# Patient Record
Sex: Female | Born: 1940
Health system: Southern US, Community
[De-identification: ages and names within clinical notes are randomized; demographics above are authoritative.]

## PROBLEM LIST (undated history)

## (undated) DIAGNOSIS — E119 Type 2 diabetes mellitus without complications: Secondary | ICD-10-CM

## (undated) DIAGNOSIS — I10 Essential (primary) hypertension: Secondary | ICD-10-CM

## (undated) DIAGNOSIS — M797 Fibromyalgia: Secondary | ICD-10-CM

## (undated) DIAGNOSIS — I872 Venous insufficiency (chronic) (peripheral): Secondary | ICD-10-CM

## (undated) DIAGNOSIS — E785 Hyperlipidemia, unspecified: Secondary | ICD-10-CM

## (undated) DIAGNOSIS — R000 Tachycardia, unspecified: Secondary | ICD-10-CM

## (undated) DIAGNOSIS — R002 Palpitations: Secondary | ICD-10-CM

## (undated) HISTORY — PX: CATARACT EXTRACTION, BILATERAL: SHX1313

## (undated) HISTORY — DX: Venous insufficiency (chronic) (peripheral): I87.2

## (undated) HISTORY — DX: Tachycardia, unspecified: R00.0

## (undated) HISTORY — DX: Essential (primary) hypertension: I10

## (undated) HISTORY — DX: Fibromyalgia: M79.7

## (undated) HISTORY — PX: ABDOMINAL HYSTERECTOMY: SHX81

## (undated) HISTORY — DX: Palpitations: R00.2

## (undated) HISTORY — DX: Hyperlipidemia, unspecified: E78.5

## (undated) HISTORY — DX: Type 2 diabetes mellitus without complications: E11.9

## (undated) HISTORY — PX: APPENDECTOMY: SHX54

## (undated) HISTORY — PX: TONSILLECTOMY: SUR1361

---

## 1998-06-16 ENCOUNTER — Ambulatory Visit (HOSPITAL_COMMUNITY): Admission: RE | Admit: 1998-06-16 | Discharge: 1998-06-16 | Payer: Self-pay | Admitting: Obstetrics and Gynecology

## 1998-06-21 ENCOUNTER — Ambulatory Visit (HOSPITAL_COMMUNITY): Admission: RE | Admit: 1998-06-21 | Discharge: 1998-06-21 | Payer: Self-pay | Admitting: Obstetrics and Gynecology

## 1999-01-20 ENCOUNTER — Other Ambulatory Visit: Admission: RE | Admit: 1999-01-20 | Discharge: 1999-01-20 | Payer: Self-pay | Admitting: Obstetrics and Gynecology

## 1999-01-26 ENCOUNTER — Encounter: Payer: Self-pay | Admitting: Obstetrics and Gynecology

## 1999-01-26 ENCOUNTER — Ambulatory Visit (HOSPITAL_COMMUNITY): Admission: RE | Admit: 1999-01-26 | Discharge: 1999-01-26 | Payer: Self-pay | Admitting: Obstetrics and Gynecology

## 1999-02-07 ENCOUNTER — Ambulatory Visit (HOSPITAL_COMMUNITY): Admission: RE | Admit: 1999-02-07 | Discharge: 1999-02-07 | Payer: Self-pay | Admitting: Gastroenterology

## 1999-06-06 ENCOUNTER — Encounter: Payer: Self-pay | Admitting: Obstetrics and Gynecology

## 1999-06-06 ENCOUNTER — Ambulatory Visit (HOSPITAL_COMMUNITY): Admission: RE | Admit: 1999-06-06 | Discharge: 1999-06-06 | Payer: Self-pay | Admitting: Obstetrics and Gynecology

## 2000-01-23 ENCOUNTER — Other Ambulatory Visit: Admission: RE | Admit: 2000-01-23 | Discharge: 2000-01-23 | Payer: Self-pay | Admitting: Obstetrics and Gynecology

## 2000-05-30 ENCOUNTER — Encounter: Payer: Self-pay | Admitting: Obstetrics and Gynecology

## 2000-05-30 ENCOUNTER — Encounter: Admission: RE | Admit: 2000-05-30 | Discharge: 2000-05-30 | Payer: Self-pay | Admitting: Obstetrics and Gynecology

## 2000-09-25 ENCOUNTER — Encounter: Admission: RE | Admit: 2000-09-25 | Discharge: 2000-09-25 | Payer: Self-pay | Admitting: Obstetrics and Gynecology

## 2000-09-25 ENCOUNTER — Encounter: Payer: Self-pay | Admitting: Obstetrics and Gynecology

## 2001-01-28 ENCOUNTER — Other Ambulatory Visit: Admission: RE | Admit: 2001-01-28 | Discharge: 2001-01-28 | Payer: Self-pay | Admitting: Obstetrics and Gynecology

## 2001-06-03 ENCOUNTER — Encounter: Payer: Self-pay | Admitting: Obstetrics and Gynecology

## 2001-06-03 ENCOUNTER — Ambulatory Visit (HOSPITAL_COMMUNITY): Admission: RE | Admit: 2001-06-03 | Discharge: 2001-06-03 | Payer: Self-pay | Admitting: Obstetrics and Gynecology

## 2002-06-05 ENCOUNTER — Ambulatory Visit (HOSPITAL_COMMUNITY): Admission: RE | Admit: 2002-06-05 | Discharge: 2002-06-05 | Payer: Self-pay | Admitting: Obstetrics and Gynecology

## 2002-06-05 ENCOUNTER — Encounter: Payer: Self-pay | Admitting: Obstetrics and Gynecology

## 2005-06-22 ENCOUNTER — Ambulatory Visit: Payer: Self-pay | Admitting: Cardiology

## 2005-12-04 ENCOUNTER — Ambulatory Visit: Payer: Self-pay | Admitting: Internal Medicine

## 2006-02-21 ENCOUNTER — Encounter: Payer: Self-pay | Admitting: Obstetrics and Gynecology

## 2006-06-26 ENCOUNTER — Ambulatory Visit: Payer: Self-pay | Admitting: Gastroenterology

## 2006-10-04 ENCOUNTER — Ambulatory Visit: Payer: Self-pay | Admitting: Gastroenterology

## 2007-01-16 ENCOUNTER — Ambulatory Visit: Payer: Self-pay | Admitting: Internal Medicine

## 2007-02-20 ENCOUNTER — Ambulatory Visit: Payer: Self-pay | Admitting: Internal Medicine

## 2007-02-28 ENCOUNTER — Ambulatory Visit: Payer: Self-pay | Admitting: Internal Medicine

## 2007-04-08 ENCOUNTER — Ambulatory Visit: Payer: Self-pay | Admitting: Internal Medicine

## 2008-01-20 ENCOUNTER — Ambulatory Visit: Payer: Self-pay | Admitting: Internal Medicine

## 2009-01-21 ENCOUNTER — Ambulatory Visit: Payer: Self-pay | Admitting: Internal Medicine

## 2010-01-24 ENCOUNTER — Ambulatory Visit: Payer: Self-pay | Admitting: Internal Medicine

## 2010-01-25 ENCOUNTER — Ambulatory Visit: Payer: Self-pay | Admitting: Internal Medicine

## 2010-07-12 ENCOUNTER — Ambulatory Visit: Payer: Self-pay | Admitting: Internal Medicine

## 2010-08-01 ENCOUNTER — Ambulatory Visit: Payer: Self-pay | Admitting: Internal Medicine

## 2010-08-15 ENCOUNTER — Ambulatory Visit: Payer: Self-pay | Admitting: Internal Medicine

## 2011-01-19 ENCOUNTER — Ambulatory Visit: Payer: Self-pay | Admitting: Internal Medicine

## 2011-03-22 ENCOUNTER — Emergency Department: Payer: Self-pay | Admitting: Emergency Medicine

## 2011-03-30 ENCOUNTER — Telehealth: Payer: Self-pay | Admitting: *Deleted

## 2011-03-30 NOTE — Telephone Encounter (Signed)
Pt called stating she wants to schedule an appt with Dr. Mariah Milling, she currently sees Dr. Darrold Junker and wants to change. She has been having palpitations with disorientation afterwards. She was seen in ER at [facility] 1 week ago, she has had a stress test at [facility] that was normal, and just recently had an echo, waiting on results. Pt had holter monitor ~ 2 years ago that was normal as well. On 12/21/10, she states she was cooking and all of the sudden had "pounding in chest, then when back to normal, she could not recognize any of her surroundings." Pt saw Dr. Randa Lynn at this time as well, and he states pt may have had TIAs and/or possible a fib. A few weeks ago pt had the exact same symptoms. Pt takes HCTZ/Triam 75-50, Crestor 10, Metoprolol succ 50, and ASA 81. Pt was scheduled to see Dr. Mariah Milling next week, pt will sign release form prior to then.

## 2011-04-06 ENCOUNTER — Encounter: Payer: Self-pay | Admitting: Cardiovascular Disease

## 2011-04-06 ENCOUNTER — Ambulatory Visit (INDEPENDENT_AMBULATORY_CARE_PROVIDER_SITE_OTHER): Payer: Medicare Other | Admitting: Cardiovascular Disease

## 2011-04-06 VITALS — BP 162/89 | HR 70 | Ht 63.0 in | Wt 163.0 lb

## 2011-04-06 DIAGNOSIS — R55 Syncope and collapse: Secondary | ICD-10-CM | POA: Insufficient documentation

## 2011-04-06 DIAGNOSIS — R000 Tachycardia, unspecified: Secondary | ICD-10-CM | POA: Insufficient documentation

## 2011-04-06 DIAGNOSIS — I10 Essential (primary) hypertension: Secondary | ICD-10-CM | POA: Insufficient documentation

## 2011-04-06 NOTE — Patient Instructions (Addendum)
  No medication changes were made. Please call us if you have new issues that need to be addressed before your next appt.  Your physician recommends that you schedule a follow-up appointment in: 1 month   Please get a BP cuff and monitor BP and heart rate and keep a diary of how often you have fast heart rate and what you are doing at the time of episode.

## 2011-04-06 NOTE — Assessment & Plan Note (Addendum)
She reports a long history of tachyarrhythmias that are brief though bothersome. It would appear that they have not been identified on Holter most recently or loop monitor last year. We have asked her to closely monitor her symptoms, keep a diary and to record her heart rate using her blood pressure cuff in an effort to identify how often She is having these episodes and how fast her heart rate is going.  She will buy a new blood pressure cuff as current  blood pressure cuff his old. We will discuss her events and arrhythmias in several weeks' time.

## 2011-04-06 NOTE — Assessment & Plan Note (Signed)
She reports some dizzy episodes, typically associated with the tachycardia. Her blood pressure is elevated today and she has not have orthostatic changes. Again we will closely watch her symptoms.

## 2011-04-06 NOTE — Progress Notes (Signed)
Patient ID: Destiny Ramos, female    DOB: Sep 30, 1941, 70 y.o.   MRN: [id number]  HPI Comments: Destiny Ramos is a very pleasant 70 year old woman, patient of Dr. Randa Lynn with a history of palpitations and tachycardia, hypertension, hyperlipidemia, fibromyalgia and chronic venous insufficiency who presents by self-referral for evaluation of recent episodes of tachycardia.  She reports that her symptoms have been going on for over a year. More recently, she has had several episodes at are concerning her. She was in art class on May 20 when she did not feel well. She was sitting and stood up to paint and she felt hot. She continued to not feel well and she went to the emergency room after people noticed her. In the emergency room, workup was essentially negative and she was discharged home.  She has had a Holter and 30 day event monitor that has shown no significant arrhythmias, apart from APCs. She reports that she did not have significant symptoms while he monitors were being worn.30 day monitor was started on April 20, 2010. Today Holter was born starting May 22  In early May, she had orthostatic type symptoms with some heart racing. Her metoprolol has been increased from 25 mg to 50 mg. Despite this, she has continued to have various episodes of tachypalpitations.  Earlier in the year in February, she had a virus and did not feel well. Around that time as she was recovering, she had periods of tachycardia while lying in bed.  She had a normal echocardiogram dated March 28, 2011 that showed normal LV systolic function, mild LVH, mild MR and TR   EKG today shows normal sinus rhythm with rate 71 beats per minute with nonspecific ST and T wave changes in V3 through V6, 2, 3, aVF     Review of Systems  Constitutional: Negative.   HENT: Negative.   Eyes: Negative.   Respiratory: Negative.   Cardiovascular: Positive for palpitations.  Gastrointestinal: Negative.   Musculoskeletal: Negative.     Skin: Negative.   Neurological: Negative.   Hematological: Negative.   Psychiatric/Behavioral: Negative.   All other systems reviewed and are negative.    BP 162/89  Pulse 70  Ht 5\' 3"  (1.6 m)  Wt 163 lb (73.936 kg)  BMI 28.87 kg/m2  Physical Exam  Nursing note and vitals reviewed. Constitutional: She is oriented to person, place, and time. She appears well-developed and well-nourished.  HENT:  Head: Normocephalic.  Nose: Nose normal.  Mouth/Throat: Oropharynx is clear and moist.  Eyes: Conjunctivae are normal. Pupils are equal, round, and reactive to light.  Neck: Normal range of motion. Neck supple. No JVD present.  Cardiovascular: Normal rate, regular rhythm, S1 normal, S2 normal, normal heart sounds and intact distal pulses.  Exam reveals no gallop and no friction rub.   No murmur heard. Pulmonary/Chest: Effort normal and breath sounds normal. No respiratory distress. She has no wheezes. She has no rales. She exhibits no tenderness.  Abdominal: Soft. Bowel sounds are normal. She exhibits no distension. There is no tenderness.  Musculoskeletal: Normal range of motion. She exhibits no edema and no tenderness.  Lymphadenopathy:    She has no cervical adenopathy.  Neurological: She is alert and oriented to person, place, and time. Coordination normal.  Skin: Skin is warm and dry. No rash noted. No erythema.  Psychiatric: She has a normal mood and affect. Her behavior is normal. Judgment and thought content normal.         Assessment and  Plan

## 2011-04-06 NOTE — Assessment & Plan Note (Signed)
Blood pressure is elevated today. She reports it is improved at home. We have asked her to write down her blood pressure numbers so we can go over these on the next visit.

## 2011-04-21 ENCOUNTER — Encounter: Payer: Self-pay | Admitting: Cardiovascular Disease

## 2011-05-08 ENCOUNTER — Ambulatory Visit (INDEPENDENT_AMBULATORY_CARE_PROVIDER_SITE_OTHER): Payer: Medicare Other | Admitting: Cardiovascular Disease

## 2011-05-08 ENCOUNTER — Encounter: Payer: Self-pay | Admitting: Cardiovascular Disease

## 2011-05-08 DIAGNOSIS — E785 Hyperlipidemia, unspecified: Secondary | ICD-10-CM

## 2011-05-08 DIAGNOSIS — R000 Tachycardia, unspecified: Secondary | ICD-10-CM

## 2011-05-08 DIAGNOSIS — E782 Mixed hyperlipidemia: Secondary | ICD-10-CM | POA: Insufficient documentation

## 2011-05-08 DIAGNOSIS — I10 Essential (primary) hypertension: Secondary | ICD-10-CM

## 2011-05-08 DIAGNOSIS — R55 Syncope and collapse: Secondary | ICD-10-CM

## 2011-05-08 NOTE — Assessment & Plan Note (Signed)
Symptoms concerning for atrial tachycardia or SVT. Episodes are of such short duration, it would not work to take an extra dose of medication as needed. She is not tolerate higher doses of metoprolol. As her symptoms are infrequent, we will monitor them for now. We have talked her carotid sinus massage and Valsalva and suggested if her symptoms last longer, go to the fire department for EKG.

## 2011-05-08 NOTE — Assessment & Plan Note (Signed)
Blood pressure is well controlled on today's visit. No changes made to the medications. 

## 2011-05-08 NOTE — Progress Notes (Signed)
Patient ID: Destiny Ramos, female    DOB: 1941/09/02, 70 y.o.   MRN: [id number]  HPI Comments: Destiny Ramos is a very pleasant 70 year old woman, patient of Dr. Randa Lynn with a history of palpitations and tachycardia, hypertension, hyperlipidemia, fibromyalgia and chronic venous insufficiency who presents for routine followup.  She reports that she recently had an episode of tachycardia. On July 7, she was sitting in her chair when she had several minutes of significant tachycardia. Her blood pressure was very low at the time and her heart rate monitor could not read the speed up her pulse. She reports that her husband did appreciate her tachycardia. She reports having at least 6 episodes over the past 2 years, starting in November 2000 and, each lasting for several minutes at a time. Overall her blood pressure has been excellent, averaging in the 110-120 range with heart rates in the 60s, occasional 70s. She had a previous episode while standing at the sink, and another episode during the stress of Thanksgiving. She has been to the emergency room for these symptoms in the past with negative workup.  She has had a Holter and 30 day event monitor that has shown no significant arrhythmias, apart from APCs. She reports that she did not have significant symptoms while he monitors were being worn.30 day monitor was started on April 20, 2010.   In early May, she had orthostatic type symptoms with some heart racing. Her metoprolol has been increased from 25 mg to 50 mg. Despite this, she has continued to have various episodes of tachypalpitations. She did not feel well on high dose metoprolol and is now back on 25 mg daily.  She had a normal echocardiogram dated March 28, 2011 that showed normal LV systolic function, mild LVH, mild MR and TR   Old EKG  shows normal sinus rhythm with rate 71 beats per minute with nonspecific ST and T wave changes in V3 through V6, 2, 3, aVF    Outpatient Encounter  Prescriptions as of 05/08/2011  Medication Sig Dispense Refill  . aspirin 81 MG EC tablet Take 81 mg by mouth daily.        . metoprolol (TOPROL-XL) 50 MG 24 hr tablet Take 25 mg by mouth daily.       . rosuvastatin (CRESTOR) 10 MG tablet Take 10 mg by mouth as directed. Take 10mg  on M,W,F      . triamterene-hydrochlorothiazide (MAXZIDE-25) 37.5-25 MG per tablet Take 1 tablet by mouth daily.           Review of Systems  Constitutional: Negative.   HENT: Negative.   Eyes: Negative.   Respiratory: Negative.   Cardiovascular: Positive for palpitations.  Gastrointestinal: Negative.   Musculoskeletal: Negative.   Skin: Negative.   Neurological: Negative.   Hematological: Negative.   Psychiatric/Behavioral: Negative.   All other systems reviewed and are negative.    BP 134/80  Pulse 80  Ht 5\' 3"  (1.6 m)  Wt 163 lb (73.936 kg)  BMI 28.87 kg/m2   Physical Exam  Nursing note and vitals reviewed. Constitutional: She is oriented to person, place, and time. She appears well-developed and well-nourished.  HENT:  Head: Normocephalic.  Nose: Nose normal.  Mouth/Throat: Oropharynx is clear and moist.  Eyes: Conjunctivae are normal. Pupils are equal, round, and reactive to light.  Neck: Normal range of motion. Neck supple. No JVD present.  Cardiovascular: Normal rate, regular rhythm, S1 normal, S2 normal, normal heart sounds and intact distal pulses.  Exam  reveals no gallop and no friction rub.   No murmur heard. Pulmonary/Chest: Effort normal and breath sounds normal. No respiratory distress. She has no wheezes. She has no rales. She exhibits no tenderness.  Abdominal: Soft. Bowel sounds are normal. She exhibits no distension. There is no tenderness.  Musculoskeletal: Normal range of motion. She exhibits no edema and no tenderness.  Lymphadenopathy:    She has no cervical adenopathy.  Neurological: She is alert and oriented to person, place, and time. Coordination normal.  Skin: Skin  is warm and dry. No rash noted. No erythema.  Psychiatric: She has a normal mood and affect. Her behavior is normal. Judgment and thought content normal.         Assessment and Plan

## 2011-05-08 NOTE — Assessment & Plan Note (Signed)
Currently on Crestor 

## 2011-05-08 NOTE — Patient Instructions (Signed)
You are doing well. No medication changes were made. Please call us if you have new issues that need to be addressed before your next appt.  We will call you for a follow up Appt. In 6 months  

## 2011-10-10 ENCOUNTER — Ambulatory Visit: Payer: Self-pay | Admitting: Internal Medicine

## 2011-10-11 ENCOUNTER — Ambulatory Visit: Payer: Self-pay | Admitting: Internal Medicine

## 2011-11-09 ENCOUNTER — Ambulatory Visit: Payer: Medicare Other | Admitting: Cardiovascular Disease

## 2011-11-10 ENCOUNTER — Encounter: Payer: Self-pay | Admitting: Cardiovascular Disease

## 2011-11-10 ENCOUNTER — Ambulatory Visit (INDEPENDENT_AMBULATORY_CARE_PROVIDER_SITE_OTHER): Payer: Medicare Other | Admitting: Cardiovascular Disease

## 2011-11-10 DIAGNOSIS — E785 Hyperlipidemia, unspecified: Secondary | ICD-10-CM

## 2011-11-10 DIAGNOSIS — I10 Essential (primary) hypertension: Secondary | ICD-10-CM

## 2011-11-10 DIAGNOSIS — R000 Tachycardia, unspecified: Secondary | ICD-10-CM

## 2011-11-10 DIAGNOSIS — R55 Syncope and collapse: Secondary | ICD-10-CM

## 2011-11-10 NOTE — Progress Notes (Signed)
Patient ID: Destiny Ramos, female    DOB: [birth year], 71 y.o.   MRN: [id number]  HPI Comments: Destiny Ramos is a very pleasant 71 year old woman, patient of Dr. Randa Lynn with a history of palpitations and tachycardia, hypertension, hyperlipidemia, fibromyalgia and chronic venous insufficiency who presents for routine followup.  She has had a Holter and 30 day event monitor that has shown no significant arrhythmias, apart from APCs. She reports that she did not have significant symptoms while he monitors were being worn.30 day monitor was started on April 20, 2010.   In early May, she had orthostatic type symptoms with some heart racing. Her metoprolol has been increased from 25 mg to 50 mg. Despite this, she has continued to have various episodes of tachypalpitations. She did not feel well on high dose metoprolol and is now back on 25 mg daily.  She reports that she has had recent bronchitis. She did a short course of amoxicillin but did not complete her antibiotic course. Symptoms came back. Now she is on Levaquin and she has started to feel better in the past several days. She has noticed her low blood pressure 2 days ago with systolic pressures in the 80s and 90s. She does have general malaise.  Previous normal echocardiogram dated March 28, 2011 that showed normal LV systolic function, mild LVH, mild MR and TR   Old EKG  shows normal sinus rhythm with rate 90 beats per minute with T Wave abnormality in V1 through V4    Outpatient Encounter Prescriptions as of 05/08/2011  Medication Sig Dispense Refill  . aspirin 81 MG EC tablet Take 81 mg by mouth daily.        . metoprolol (TOPROL-XL) 50 MG 24 hr tablet Take 25 mg by mouth daily.       . rosuvastatin (CRESTOR) 10 MG tablet Take 10 mg by mouth as directed. Take 10mg  on M,W,F      . triamterene-hydrochlorothiazide (MAXZIDE-25) 37.5-25 MG per tablet Take 1 tablet by mouth daily.           Review of Systems  Constitutional: Negative.     HENT: Negative.   Eyes: Negative.   Respiratory: Negative.   Cardiovascular: Positive for palpitations.  Gastrointestinal: Negative.   Musculoskeletal: Negative.   Skin: Negative.   Neurological: Negative.   Hematological: Negative.   Psychiatric/Behavioral: Negative.   All other systems reviewed and are negative.    BP 140/90  Pulse 90  Ht 5\' 3"  (1.6 m)  Wt 168 lb (76.204 kg)  BMI 29.76 kg/m2   Physical Exam  Nursing note and vitals reviewed. Constitutional: She is oriented to person, place, and time. She appears well-developed and well-nourished.  HENT:  Head: Normocephalic.  Nose: Nose normal.  Mouth/Throat: Oropharynx is clear and moist.  Eyes: Conjunctivae are normal. Pupils are equal, round, and reactive to light.  Neck: Normal range of motion. Neck supple. No JVD present.  Cardiovascular: Normal rate, regular rhythm, S1 normal, S2 normal, normal heart sounds and intact distal pulses.  Exam reveals no gallop and no friction rub.   No murmur heard. Pulmonary/Chest: Effort normal and breath sounds normal. No respiratory distress. She has no wheezes. She has no rales. She exhibits no tenderness.  Abdominal: Soft. Bowel sounds are normal. She exhibits no distension. There is no tenderness.  Musculoskeletal: Normal range of motion. She exhibits no edema and no tenderness.  Lymphadenopathy:    She has no cervical adenopathy.  Neurological: She is alert and oriented  to person, place, and time. Coordination normal.  Skin: Skin is warm and dry. No rash noted. No erythema.  Psychiatric: She has a normal mood and affect. Her behavior is normal. Judgment and thought content normal.         Assessment and Plan

## 2011-11-10 NOTE — Patient Instructions (Addendum)
You are doing well. If your blood pressure is running low, hold the triamterene/HCTZ. You may want to do a half a blood pressure pill while sick.   Please call us if you have new issues that need to be addressed before your next appt.  Your physician wants you to follow-up in: 6 months.  You will receive a reminder letter in the mail two months in advance. If you don't receive a letter, please call our office to schedule the follow-up appointment.

## 2011-11-10 NOTE — Assessment & Plan Note (Signed)
Rare episodes of tachycardia. She does take extra metoprolol as needed.

## 2011-11-10 NOTE — Assessment & Plan Note (Signed)
Blood pressure is running low in the setting of recent bronchitis. I think she is turning the corner and started to feel better. I suggested she cut her triamterene HCTZ and half And closely monitor her blood pressure to avoid hypotension. Once her bronchitis has improved, she could possibly go back on a full pill.

## 2011-11-10 NOTE — Assessment & Plan Note (Signed)
Continue Crestor 

## 2011-11-13 ENCOUNTER — Telehealth: Payer: Self-pay | Admitting: *Deleted

## 2011-11-13 NOTE — Telephone Encounter (Signed)
Pt held triamterene/hctz starting Sat AM (appt 1/18, low BP due to acute bronchitis, instructed to hold if BP low and take 1/2 while still sick). Pt actually just held med entire time. This AM c/o swelling in both knees and painful. She thinks she has almost fully recovered from bronchitis, and BPs since 1/18 (and off triam/hctz) have been: 144/63, 116/67, 140/67, 136/69, 122/61, HR in 70s. I told pt to go ahead and go back on med starting with 1/2 tab, she said she already took a whole tablet this Am. I advised if BP does ok on whole tablet, to continue and call back Friday with update of fluid and BP. Will forward to Dr. Mariah Milling.

## 2011-11-14 ENCOUNTER — Telehealth: Payer: Self-pay

## 2011-11-14 NOTE — Telephone Encounter (Signed)
The patient is still having swelling in her knees up to her thigh. She has taken the full tablet of the triam/hctz as of yesterday like she was instructed. Denies shortness of breath. She does not have a lot of output. She is drinking a lot of water. Please advise what to do.   Lanny Hurst, RN 11/13/2011 12:06 PM Signed  Pt held triamterene/hctz starting Sat AM (appt 1/18, low BP due to acute bronchitis, instructed to hold if BP low and take 1/2 while still sick). Pt actually just held med entire time. This AM c/o swelling in both knees and painful. She thinks she has almost fully recovered from bronchitis, and BPs since 1/18 (and off triam/hctz) have been: 144/63, 116/67, 140/67, 136/69, 122/61, HR in 70s. I told pt to go ahead and go back on med starting with 1/2 tab, she said she already took a whole tablet this Am. I advised if BP does ok on whole tablet, to continue and call back Friday with update of fluid and BP. Will forward to Dr. Mariah Milling.

## 2011-11-14 NOTE — Telephone Encounter (Signed)
Patient is c/o edema in knees.

## 2011-11-15 NOTE — Telephone Encounter (Signed)
Notified to cut back on fluid intake if fluid retention still in knee and thigh area. Told echo did not show excess fluids in her and that she may need to see Dr. Randa Lynn if cutting back on fluids does not help the swelling in knee and thigh area. She will also continue with the  BP pill as she was doing before she cut it down. She will monitor her BP at home. The patient will follow instructions as above per Dr. Mariah Milling.

## 2011-11-15 NOTE — Telephone Encounter (Signed)
Fluid retention would probably start in her ankles, not knees/thighs. Would continue BP pill as she was doing before she cut down. Monitor BP at home. If she thinks knee and thigh swelling is fluid retention, cut back on fluid intake. May need to see Dr. Randa Lynn if knee thigh swelling persists despite cutting back on fluids.  Previous echo did not show excess fluids in her.

## 2011-11-20 ENCOUNTER — Ambulatory Visit: Payer: Medicare Other | Admitting: Cardiovascular Disease

## 2012-02-06 ENCOUNTER — Telehealth: Payer: Self-pay | Admitting: Cardiovascular Disease

## 2012-02-06 NOTE — Telephone Encounter (Signed)
Spoke with pt. Pt states while reading paper on Saturday she felt dizzy,  like she might black out, but she did not black out. Saturday her BP was 136/70 pulse 86 about 20-30 minutes later.

## 2012-02-06 NOTE — Telephone Encounter (Signed)
Would stay on metoprolol succinate 25 mg daily She could hold her triamterene HCTZ We can get her a new prescription for HCTZ 25 mg She can take a half HCTZ for 12.5 mg daily as needed for blood pressure greater than 130  Her blood pressure was low on her last clinic visit. Initially it was felt secondary to infection/bronchitis. Would picture that she is not dehydrated as HCTZ can cause mild dehydration.

## 2012-02-06 NOTE — Telephone Encounter (Signed)
Pt states she took both maxide and metoprolol on Saturday but has not taken any maxide since Saturday 02/03/12.  BP 4/14 125/65 pulse 73 left arm/ 104/64 right arm 4/15 121/59 pulse 63 02/06/12 130/58 pulse 85. Pt states she has not had anymore dizziness since Saturday.Pt asking what she should do about her medication. I will forward to Dr Mariah Milling for review and recommendations.

## 2012-02-06 NOTE — Telephone Encounter (Signed)
Pt calling stating that her BP has been running low. Hasn't taken her BP meds since late Saturday. Wants to know what she should do  13th 136/70 pulse 86 14th 125/65 pulse 73 104/64 in right arm 15th 121/59 pulse 63

## 2012-02-07 ENCOUNTER — Telehealth: Payer: Self-pay | Admitting: *Deleted

## 2012-02-07 ENCOUNTER — Other Ambulatory Visit: Payer: Self-pay | Admitting: *Deleted

## 2012-02-07 DIAGNOSIS — I951 Orthostatic hypotension: Secondary | ICD-10-CM

## 2012-02-07 MED ORDER — HYDROCHLOROTHIAZIDE 12.5 MG PO CAPS: ORAL_CAPSULE | ORAL | Status: DC

## 2012-02-07 NOTE — Telephone Encounter (Signed)
LMTCB--NT 

## 2012-02-07 NOTE — Telephone Encounter (Signed)
Spoke with pt and explained dr Ethelene Hal instructions when taking HCTZ--called in RX to [facility]--- HCTZ 12.5mg  --take 1/2 tab po qd if systolic blood pressure over 130--pt agrees--nt

## 2012-05-08 ENCOUNTER — Ambulatory Visit: Payer: Medicare Other | Admitting: Cardiovascular Disease

## 2012-07-12 ENCOUNTER — Ambulatory Visit: Payer: Medicare Other | Admitting: Cardiovascular Disease

## 2012-07-15 ENCOUNTER — Ambulatory Visit: Payer: Medicare Other | Admitting: Cardiovascular Disease

## 2012-08-01 ENCOUNTER — Ambulatory Visit: Payer: Medicare Other | Admitting: Cardiovascular Disease

## 2012-08-16 ENCOUNTER — Ambulatory Visit: Payer: Medicare Other | Admitting: Cardiovascular Disease

## 2012-08-19 ENCOUNTER — Encounter: Payer: Self-pay | Admitting: Cardiovascular Disease

## 2012-11-19 ENCOUNTER — Encounter: Payer: Self-pay | Admitting: Cardiovascular Disease

## 2012-11-19 ENCOUNTER — Ambulatory Visit (INDEPENDENT_AMBULATORY_CARE_PROVIDER_SITE_OTHER): Payer: Medicare Other | Admitting: Cardiovascular Disease

## 2012-11-19 VITALS — BP 137/85 | HR 94 | Ht 63.0 in | Wt 168.0 lb

## 2012-11-19 DIAGNOSIS — R000 Tachycardia, unspecified: Secondary | ICD-10-CM

## 2012-11-19 DIAGNOSIS — R55 Syncope and collapse: Secondary | ICD-10-CM

## 2012-11-19 DIAGNOSIS — E785 Hyperlipidemia, unspecified: Secondary | ICD-10-CM

## 2012-11-19 DIAGNOSIS — I10 Essential (primary) hypertension: Secondary | ICD-10-CM

## 2012-11-19 DIAGNOSIS — R42 Dizziness and giddiness: Secondary | ICD-10-CM | POA: Insufficient documentation

## 2012-11-19 NOTE — Assessment & Plan Note (Addendum)
lightheaded symptoms of uncertain etiology, possibly from her new diet. Recent 6 pound weight loss in the past week on shakes . We have asked her to start eating normally, encouraged fluids . No recent medication changes . Less likely cardiac etiology as rhythm and blood pressure is normal.

## 2012-11-19 NOTE — Progress Notes (Signed)
Patient ID: Destiny Ramos, female    DOB: [birth year], 72 y.o.   MRN: [id number]  HPI Comments: Destiny Ramos is a very pleasant 72 year old woman, patient of Dr. Randa Lynn with a history of palpitations and tachycardia, hypertension, hyperlipidemia, fibromyalgia and chronic venous insufficiency who presents for routine followup.  Prior Holter and 30 day event monitor showed  no significant arrhythmias, apart from APCs. She reports that she did not have significant symptoms while he monitors were being worn.30 day monitor was started on April 20, 2010.   In early May 2013, she had orthostatic type symptoms with some heart racing. Her metoprolol has been increased from 25 mg to 50 mg. Despite this, she has continued to have various episodes of tachypalpitations.   She reports starting a new protein shake diet over the past week. 2 shakes per day and she has dropped 6 pounds. She did have a episode while on this new diet of severe lightheadedness while driving. She managed to drive home and symptoms seemed to resolve on their own. She continues to feel lightheaded. Blood pressure at home and here today is in a reasonable range. She denies any tachycardia or palpitations  Previous normal echocardiogram dated March 28, 2011 that showed normal LV systolic function, mild LVH, mild MR and TR    EKG  shows normal sinus rhythm with rate 89 beats per minute with T Wave abnormality in V1 through V4    Outpatient Encounter Prescriptions as of 11/19/2012  Medication Sig Dispense Refill  . albuterol (PROVENTIL HFA;VENTOLIN HFA) 108 (90 BASE) MCG/ACT inhaler Inhale 2 puffs into the lungs every 6 (six) hours as needed.      Marland Kitchen aspirin 81 MG EC tablet Take 81 mg by mouth daily.        . hydrochlorothiazide (MICROZIDE) 12.5 MG capsule Take 1/2 tab po qd for systolic blood pressure over 130--do not take if systolic blood pressure under  130  15 capsule  11  . metoprolol succinate (TOPROL-XL) 25 MG 24 hr tablet Take 25  mg by mouth daily.      . [DISCONTINUED] Guaifenesin (MUCINEX MAXIMUM STRENGTH) 1200 MG TB12 Take 1,200 mg by mouth daily.      . [DISCONTINUED] levofloxacin (LEVAQUIN) 500 MG tablet Take 500 mg by mouth daily.      . [DISCONTINUED] metoprolol succinate (TOPROL-XL) 25 MG 24 hr tablet Take 25 mg by mouth daily.      . [DISCONTINUED] rosuvastatin (CRESTOR) 10 MG tablet Take 10 mg by mouth as directed. Take 10mg  on M,W,F         Review of Systems  Constitutional: Negative.   HENT: Negative.   Eyes: Negative.   Respiratory: Negative.   Cardiovascular: Positive for palpitations.  Gastrointestinal: Negative.   Musculoskeletal: Negative.   Skin: Negative.   Neurological: Positive for light-headedness.  Hematological: Negative.   Psychiatric/Behavioral: Negative.   All other systems reviewed and are negative.    BP 137/85  Pulse 94  Ht 5\' 3"  (1.6 m)  Wt 168 lb (76.204 kg)  BMI 29.76 kg/m2  Physical Exam  Nursing note and vitals reviewed. Constitutional: She is oriented to person, place, and time. She appears well-developed and well-nourished.  HENT:  Head: Normocephalic.  Nose: Nose normal.  Mouth/Throat: Oropharynx is clear and moist.  Eyes: Conjunctivae normal are normal. Pupils are equal, round, and reactive to light.  Neck: Normal range of motion. Neck supple. No JVD present.  Cardiovascular: Normal rate, regular rhythm, S1 normal, S2 normal,  normal heart sounds and intact distal pulses.  Exam reveals no gallop and no friction rub.   No murmur heard. Pulmonary/Chest: Effort normal and breath sounds normal. No respiratory distress. She has no wheezes. She has no rales. She exhibits no tenderness.  Abdominal: Soft. Bowel sounds are normal. She exhibits no distension. There is no tenderness.  Musculoskeletal: Normal range of motion. She exhibits no edema and no tenderness.  Lymphadenopathy:    She has no cervical adenopathy.  Neurological: She is alert and oriented to person,  place, and time. Coordination normal.  Skin: Skin is warm and dry. No rash noted. No erythema.  Psychiatric: She has a normal mood and affect. Her behavior is normal. Judgment and thought content normal.         Assessment and Plan

## 2012-11-19 NOTE — Assessment & Plan Note (Signed)
No recent episodes of tachycardia. Continue beta blocker.

## 2012-11-19 NOTE — Assessment & Plan Note (Signed)
Blood pressure is well controlled on today's visit. No changes made to the medications. 

## 2012-11-19 NOTE — Patient Instructions (Addendum)
You are doing well. No medication changes were made.  Please call us if you have new issues that need to be addressed before your next appt.  Your physician wants you to follow-up in: 6 months.  You will receive a reminder letter in the mail two months in advance. If you don't receive a letter, please call our office to schedule the follow-up appointment.   

## 2012-11-19 NOTE — Assessment & Plan Note (Signed)
Previously on Crestor. She stopped this on her own

## 2013-05-19 ENCOUNTER — Ambulatory Visit (INDEPENDENT_AMBULATORY_CARE_PROVIDER_SITE_OTHER): Payer: Medicare Other | Admitting: Cardiovascular Disease

## 2013-05-19 ENCOUNTER — Telehealth: Payer: Self-pay

## 2013-05-19 ENCOUNTER — Encounter: Payer: Self-pay | Admitting: Cardiovascular Disease

## 2013-05-19 VITALS — BP 160/82 | HR 81 | Ht 62.0 in | Wt 174.2 lb

## 2013-05-19 DIAGNOSIS — R0789 Other chest pain: Secondary | ICD-10-CM

## 2013-05-19 DIAGNOSIS — R000 Tachycardia, unspecified: Secondary | ICD-10-CM

## 2013-05-19 DIAGNOSIS — R55 Syncope and collapse: Secondary | ICD-10-CM

## 2013-05-19 DIAGNOSIS — E785 Hyperlipidemia, unspecified: Secondary | ICD-10-CM

## 2013-05-19 DIAGNOSIS — I10 Essential (primary) hypertension: Secondary | ICD-10-CM

## 2013-05-19 DIAGNOSIS — R002 Palpitations: Secondary | ICD-10-CM

## 2013-05-19 DIAGNOSIS — E7800 Pure hypercholesterolemia, unspecified: Secondary | ICD-10-CM

## 2013-05-19 MED ORDER — PROPRANOLOL HCL 10 MG PO TABS: 10.0000 mg | ORAL_TABLET | Freq: Three times a day (TID) | ORAL | Status: DC | PRN

## 2013-05-19 NOTE — Patient Instructions (Addendum)
You are doing well. Please take propranolol as needed for palpitations  Please call us if you have new issues that need to be addressed before your next appt.  Your physician wants you to follow-up in: 6 months.  You will receive a reminder letter in the mail two months in advance. If you don't receive a letter, please call our office to schedule the follow-up appointment.

## 2013-05-19 NOTE — Progress Notes (Signed)
Patient ID: Destiny Ramos, female    DOB: 11-20-1940, 72 y.o.   MRN: [id number]  HPI Comments: Destiny Ramos is a very pleasant 72 year old woman, patient of Dr. Randa Lynn with a history of palpitations and tachycardia, hypertension, hyperlipidemia, fibromyalgia and chronic venous insufficiency who presents for routine followup. She reports a previous near syncope episode, fall with concussion several years ago  Prior Holter for 30 day event monitor showed  no significant arrhythmias, apart from APCs. She reports that she did not have significant symptoms while he monitors were being worn.30 day monitor was started on April 20, 2010.   In early May 2013, she had orthostatic type symptoms with some heart racing. Her metoprolol has been increased from 25 mg to 50 mg.   She is doing well overall. Very active on a choir. Does not exercise on a regular basis. Has palpitations, fluttering once per month. Episodes while she is cooking in the kitchen and she has to sit down. After she sits down, symptoms resolved. She typically takes metoprolol when necessary for breakthrough.  Previous normal echocardiogram dated March 28, 2011 that showed normal LV systolic function, mild LVH, mild MR and TR    EKG  shows normal sinus rhythm with rate 81 beats per minute with T Wave abnormality in V1 through V4  Total cholesterol 152 several years ago, normal echo in 2012   Outpatient Encounter Prescriptions as of 05/19/2013  Medication Sig Dispense Refill  . albuterol (PROVENTIL HFA;VENTOLIN HFA) 108 (90 BASE) MCG/ACT inhaler Inhale 2 puffs into the lungs every 6 (six) hours as needed.      Marland Kitchen aspirin 81 MG EC tablet Take 81 mg by mouth daily.        . hydrochlorothiazide (MICROZIDE) 12.5 MG capsule Take 1/2 tab po qd for systolic blood pressure over 130--do not take if systolic blood pressure under  130  15 capsule  11  . metoprolol succinate (TOPROL-XL) 25 MG 24 hr tablet Take 25 mg by mouth daily.       No  facility-administered encounter medications on file as of 05/19/2013.     Review of Systems  Constitutional: Negative.   HENT: Negative.   Eyes: Negative.   Respiratory: Negative.   Cardiovascular: Positive for palpitations.  Gastrointestinal: Negative.   Musculoskeletal: Negative.   Skin: Negative.   Psychiatric/Behavioral: Negative.   All other systems reviewed and are negative.    BP 160/82  Pulse 81  Ht 5\' 2"  (1.575 m)  Wt 174 lb 4 oz (79.039 kg)  BMI 31.86 kg/m2  Physical Exam  Nursing note and vitals reviewed. Constitutional: She is oriented to person, place, and time. She appears well-developed and well-nourished.  HENT:  Head: Normocephalic.  Nose: Nose normal.  Mouth/Throat: Oropharynx is clear and moist.  Eyes: Conjunctivae are normal. Pupils are equal, round, and reactive to light.  Neck: Normal range of motion. Neck supple. No JVD present.  Cardiovascular: Normal rate, regular rhythm, S1 normal, S2 normal, normal heart sounds and intact distal pulses.  Exam reveals no gallop and no friction rub.   No murmur heard. Pulmonary/Chest: Effort normal and breath sounds normal. No respiratory distress. She has no wheezes. She has no rales. She exhibits no tenderness.  Abdominal: Soft. Bowel sounds are normal. She exhibits no distension. There is no tenderness.  Musculoskeletal: Normal range of motion. She exhibits no edema and no tenderness.  Lymphadenopathy:    She has no cervical adenopathy.  Neurological: She is alert and oriented to  person, place, and time. Coordination normal.  Skin: Skin is warm and dry. No rash noted. No erythema.  Psychiatric: She has a normal mood and affect. Her behavior is normal. Judgment and thought content normal.    Assessment and Plan

## 2013-05-19 NOTE — Telephone Encounter (Signed)
Attempted TCB pt.  LMTCB.

## 2013-05-19 NOTE — Assessment & Plan Note (Signed)
No recent labs available. Lab work from 2012 showed total cholesterol 150

## 2013-05-19 NOTE — Assessment & Plan Note (Signed)
No further episodes of near syncope, passing out. Previous concussion.

## 2013-05-19 NOTE — Assessment & Plan Note (Signed)
We have suggested she take propranolol as needed for breakthrough palpitations or tachycardia and call our office if this happens more frequently.

## 2013-05-19 NOTE — Telephone Encounter (Signed)
Called spoke with pt. Pt saw Dr Mariah Milling today.  Had cholesterol checked on 04/28/13 lifeline screening at the [facility]. Will forward results to Dr Mariah Milling for review.

## 2013-05-19 NOTE — Assessment & Plan Note (Signed)
Blood pressure is well controlled on today's visit. No changes made to the medications. 

## 2013-05-19 NOTE — Telephone Encounter (Signed)
Pt left VM.Marland Kitchen Total cholesterol 212, HDL 53, LDL 132, triglycerides 136

## 2013-05-19 NOTE — Telephone Encounter (Signed)
Cholesterol is a little high Which she like a generic cholesterol medication such as lovastatin 20 mg daily?

## 2013-05-20 MED ORDER — LOVASTATIN 20 MG PO TABS: 20.0000 mg | ORAL_TABLET | Freq: Every day | ORAL | Status: DC

## 2013-05-20 NOTE — Telephone Encounter (Signed)
The patient is aware of Dr. Mariah Milling recommendations and is agreeable with starting lovastatin 20 mg daily. RX sent to [facility] pharmacy.

## 2013-10-21 ENCOUNTER — Ambulatory Visit: Payer: Self-pay | Admitting: Internal Medicine

## 2013-11-19 ENCOUNTER — Ambulatory Visit (INDEPENDENT_AMBULATORY_CARE_PROVIDER_SITE_OTHER): Payer: Medicare Other | Admitting: Cardiovascular Disease

## 2013-11-19 ENCOUNTER — Encounter: Payer: Self-pay | Admitting: Cardiovascular Disease

## 2013-11-19 VITALS — BP 135/75 | HR 100 | Ht 63.0 in | Wt 166.8 lb

## 2013-11-19 DIAGNOSIS — I10 Essential (primary) hypertension: Secondary | ICD-10-CM

## 2013-11-19 DIAGNOSIS — E785 Hyperlipidemia, unspecified: Secondary | ICD-10-CM

## 2013-11-19 DIAGNOSIS — R55 Syncope and collapse: Secondary | ICD-10-CM

## 2013-11-19 DIAGNOSIS — R000 Tachycardia, unspecified: Secondary | ICD-10-CM

## 2013-11-19 MED ORDER — LOVASTATIN 20 MG PO TABS: 20.0000 mg | ORAL_TABLET | Freq: Every day | ORAL | Status: DC

## 2013-11-19 NOTE — Progress Notes (Signed)
Patient ID: Jaanvi Fizer, female    DOB: [birth year], 73 y.o.   MRN: [id number]  HPI Comments: Ms. Latella is a very pleasant 73 year old woman, patient of Dr. Emily Filbert with a history of palpitations and tachycardia, hypertension, hyperlipidemia, fibromyalgia and chronic venous insufficiency who presents for routine followup. Previous episode of near syncope, fall with concussion several years ago  Prior Holter for 30 day event monitor showed  no significant arrhythmias, apart from APCs. She reports that she did not have significant symptoms while he monitors were being worn.30 day monitor was started on April 20, 2010.   In early May 2013, she had orthostatic type symptoms with some heart racing.   In followup today, She is doing well overall. Very active on a choir. Does not exercise on a regular basis. Has palpitations, fluttering rarely. Previously had Episodes while she is cooking in the kitchen and she has to sit down. After she sits down, symptoms resolved. She typically takes metoprolol when necessary for breakthrough. 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 Symptoms resolved in less than 1 minute. No further episodes since that time. She has taken herself off Crestor. Uncertain why.  Previous normal echocardiogram dated March 28, 2011 that showed normal LV systolic function, mild LVH, mild MR and TR    EKG  shows normal sinus rhythm with rate 100 beats per minute with T Wave abnormality in V1 through V4  Total cholesterol 152 several years ago while on a statin.  Most recent cholesterol 226, LDL 141   Outpatient Encounter Prescriptions as of 11/19/2013  Medication Sig  . albuterol (PROVENTIL HFA;VENTOLIN HFA) 108 (90 BASE) MCG/ACT inhaler Inhale 2 puffs into the lungs every 6 (six) hours as needed.  Marland Kitchen aspirin 81 MG EC tablet Take 81 mg by mouth daily.    . hydrochlorothiazide (MICROZIDE) 12.5 MG capsule Take 1/2 tab po qd for systolic  blood pressure over 130--do not take if systolic blood pressure under  130  . metoprolol succinate (TOPROL-XL) 25 MG 24 hr tablet Take 25 mg by mouth daily.  . propranolol (INDERAL) 10 MG tablet Take 1 tablet (10 mg total) by mouth 3 (three) times daily as needed.     Review of Systems  Constitutional: Negative.   HENT: Negative.   Eyes: Negative.   Respiratory: Negative.   Cardiovascular: Negative.   Gastrointestinal: Negative.   Endocrine: Negative.   Musculoskeletal: Negative.   Skin: Negative.   Allergic/Immunologic: Negative.   Neurological: Negative.   Hematological: Negative.   Psychiatric/Behavioral: Negative.   All other systems reviewed and are negative.    BP 170/78  Pulse 100  Ht 5\' 3"  (1.6 m)  Wt 166 lb 12 oz (75.637 kg)  BMI 29.55 kg/m2  Physical Exam  Nursing note and vitals reviewed. Constitutional: She is oriented to person, place, and time. She appears well-developed and well-nourished.  HENT:  Head: Normocephalic.  Nose: Nose normal.  Mouth/Throat: Oropharynx is clear and moist.  Eyes: Conjunctivae are normal. Pupils are equal, round, and reactive to light.  Neck: Normal range of motion. Neck supple. No JVD present.  Cardiovascular: Normal rate, regular rhythm, S1 normal, S2 normal, normal heart sounds and intact distal pulses.  Exam reveals no gallop and no friction rub.   No murmur heard. Pulmonary/Chest: Effort normal and breath sounds normal. No respiratory distress. She has no wheezes. She has no rales. She exhibits no tenderness.  Abdominal: Soft. Bowel sounds are normal. She exhibits  no distension. There is no tenderness.  Musculoskeletal: Normal range of motion. She exhibits no edema and no tenderness.  Lymphadenopathy:    She has no cervical adenopathy.  Neurological: She is alert and oriented to person, place, and time. Coordination normal.  Skin: Skin is warm and dry. No rash noted. No erythema.  Psychiatric: She has a normal mood and  affect. Her behavior is normal. Judgment and thought content normal.    Assessment and Plan

## 2013-11-19 NOTE — Assessment & Plan Note (Signed)
We have recommended that she start lovastatin 20 mg daily. She is willing to start a statin.

## 2013-11-19 NOTE — Assessment & Plan Note (Signed)
Symptoms reasonably well controlled on her current medication regimen.

## 2013-11-19 NOTE — Assessment & Plan Note (Signed)
Blood pressure is well controlled on today's visit. No changes made to the medications. 

## 2013-11-19 NOTE — Assessment & Plan Note (Signed)
1 recent episode of near-syncope lasting less than 1 minute. Prior workup unrevealing. Suggested she call our office if she has additional episodes

## 2013-11-19 NOTE — Patient Instructions (Signed)
You are doing well. Please start lovastatin one a day for cholesterol  Please call us if you have new issues that need to be addressed before your next appt.  Your physician wants you to follow-up in: 6 months.  You will receive a reminder letter in the mail two months in advance. If you don't receive a letter, please call our office to schedule the follow-up appointment.

## 2014-01-29 ENCOUNTER — Ambulatory Visit: Payer: Self-pay | Admitting: Family Medicine

## 2014-05-18 ENCOUNTER — Ambulatory Visit: Payer: Medicare Other | Admitting: Cardiovascular Disease

## 2014-07-08 ENCOUNTER — Ambulatory Visit (INDEPENDENT_AMBULATORY_CARE_PROVIDER_SITE_OTHER): Payer: Medicare Other | Admitting: Cardiovascular Disease

## 2014-07-08 ENCOUNTER — Encounter: Payer: Self-pay | Admitting: Cardiovascular Disease

## 2014-07-08 VITALS — BP 150/90 | HR 89 | Ht 63.0 in | Wt 174.5 lb

## 2014-07-08 DIAGNOSIS — E785 Hyperlipidemia, unspecified: Secondary | ICD-10-CM

## 2014-07-08 DIAGNOSIS — R000 Tachycardia, unspecified: Secondary | ICD-10-CM

## 2014-07-08 DIAGNOSIS — I10 Essential (primary) hypertension: Secondary | ICD-10-CM

## 2014-07-08 DIAGNOSIS — E669 Obesity, unspecified: Secondary | ICD-10-CM

## 2014-07-08 MED ORDER — ATORVASTATIN CALCIUM 10 MG PO TABS: 10.0000 mg | ORAL_TABLET | Freq: Every day | ORAL | Status: DC

## 2014-07-08 NOTE — Assessment & Plan Note (Signed)
No recent episodes of tachycardia or palpitations. Encouraged her again to take metoprolol as needed for breakthrough arrhythmia

## 2014-07-08 NOTE — Assessment & Plan Note (Signed)
Blood pressure is well controlled on today's visit. No changes made to the medications. 

## 2014-07-08 NOTE — Assessment & Plan Note (Signed)
She is willing to retry a cholesterol medication. We will start atorvastatin 10 mg daily

## 2014-07-08 NOTE — Progress Notes (Signed)
Patient ID: Destiny Ramos, female    DOB: 1941-08-17, 73 y.o.   MRN: [id number]  HPI Comments: Destiny Ramos is a very pleasant 73 year old woman, patient of Dr. Emily Filbert with a history of palpitations and tachycardia, hypertension, hyperlipidemia, fibromyalgia and chronic venous insufficiency who presents for routine followup. Previous episode of near syncope, fall with concussion several years ago She presents for routine followup  In general she reports that she is doing well. Her weight continues to trend upwards. Weight is up 8 pounds from her prior clinic visit in early 2015. She is eating socially, no regular exercise He denies any significant tachycardia or palpitations. Very active on a choir.  cooking in the kitchen a lot. She typically takes metoprolol when necessary for breakthrough palpitations She has taken herself off Crestor. Uncertain why. She is willing to retry a cholesterol medication, generic Notes indicate that she was on lovastatin in the past as well  Prior Holter for 30 day event monitor showed  no significant arrhythmias, apart from APCs. She reports that she did not have significant symptoms while the monitors was being worn. 30 day monitor was started on April 20, 2010.   In early May 2013, she had orthostatic type symptoms with some heart racing.   Previous normal echocardiogram dated March 28, 2011 that showed normal LV systolic function, mild LVH, mild MR and TR    EKG  shows normal sinus rhythm with rate 89 beats per minute with T Wave abnormality in V1 through V3  Total cholesterol 152 several years ago while on a statin.  Most recent cholesterol 226, LDL 141, this was prior to her weight gain  Outpatient Encounter Prescriptions as of 07/08/2014  Medication Sig  . albuterol (PROVENTIL HFA;VENTOLIN HFA) 108 (90 BASE) MCG/ACT inhaler Inhale 2 puffs into the lungs every 6 (six) hours as needed.  Marland Kitchen aspirin 81 MG EC tablet Take 81 mg by mouth daily.    .  hydrochlorothiazide (MICROZIDE) 12.5 MG capsule Take 1/2 tab po qd for systolic blood pressure over 130--do not take if systolic blood pressure under  130  . metoprolol succinate (TOPROL-XL) 25 MG 24 hr tablet Take 25 mg by mouth daily.  . propranolol (INDERAL) 10 MG tablet Take 1 tablet (10 mg total) by mouth 3 (three) times daily as needed.  . [DISCONTINUED] lovastatin (MEVACOR) 20 MG tablet Take 1 tablet (20 mg total) by mouth at bedtime.    Review of Systems  Constitutional: Negative.   HENT: Negative.   Eyes: Negative.   Respiratory: Negative.   Cardiovascular: Negative.   Gastrointestinal: Negative.   Endocrine: Negative.   Musculoskeletal: Negative.   Skin: Negative.   Allergic/Immunologic: Negative.   Neurological: Negative.   Hematological: Negative.   Psychiatric/Behavioral: Negative.   All other systems reviewed and are negative.   BP 150/90  Pulse 89  Ht 5\' 3"  (1.6 m)  Wt 174 lb 8 oz (79.153 kg)  BMI 30.92 kg/m2  Physical Exam  Nursing note and vitals reviewed. Constitutional: She is oriented to person, place, and time. She appears well-developed and well-nourished.  Obese  HENT:  Head: Normocephalic.  Nose: Nose normal.  Mouth/Throat: Oropharynx is clear and moist.  Eyes: Conjunctivae are normal. Pupils are equal, round, and reactive to light.  Neck: Normal range of motion. Neck supple. No JVD present.  Cardiovascular: Normal rate, regular rhythm, S1 normal, S2 normal, normal heart sounds and intact distal pulses.  Exam reveals no gallop and no friction rub.  No murmur heard. Pulmonary/Chest: Effort normal and breath sounds normal. No respiratory distress. She has no wheezes. She has no rales. She exhibits no tenderness.  Abdominal: Soft. Bowel sounds are normal. She exhibits no distension. There is no tenderness.  Musculoskeletal: Normal range of motion. She exhibits no edema and no tenderness.  Lymphadenopathy:    She has no cervical adenopathy.   Neurological: She is alert and oriented to person, place, and time. Coordination normal.  Skin: Skin is warm and dry. No rash noted. No erythema.  Psychiatric: She has a normal mood and affect. Her behavior is normal. Judgment and thought content normal.    Assessment and Plan

## 2014-07-08 NOTE — Assessment & Plan Note (Signed)
We have encouraged continued exercise, careful diet management in an effort to lose weight. 

## 2014-07-08 NOTE — Patient Instructions (Addendum)
You are doing well. Please start atorvastatin 10 mg once a day, evening  For cholesterol  Please call us if you have new issues that need to be addressed before your next appt.  Your physician wants you to follow-up in: 6 months.  You will receive a reminder letter in the mail two months in advance. If you don't receive a letter, please call our office to schedule the follow-up appointment.

## 2015-01-05 DIAGNOSIS — H6121 Impacted cerumen, right ear: Secondary | ICD-10-CM | POA: Diagnosis not present

## 2015-01-05 DIAGNOSIS — H6122 Impacted cerumen, left ear: Secondary | ICD-10-CM | POA: Diagnosis not present

## 2015-03-08 ENCOUNTER — Telehealth: Payer: Self-pay | Admitting: *Deleted

## 2015-03-08 ENCOUNTER — Telehealth: Payer: Self-pay | Admitting: Cardiovascular Disease

## 2015-03-08 NOTE — Telephone Encounter (Signed)
°  1. Which medications need to be refilled? Triam HCTZ  2. Which pharmacy is medication to be sent to? [facility]   3. Do they need a 30 day or 90 day supply? 30 day   4. Would they like a call back once the medication has been sent to the pharmacy? No

## 2015-03-08 NOTE — Telephone Encounter (Signed)
Patient returned VM Please call patient back

## 2015-03-08 NOTE — Telephone Encounter (Signed)
Patient has been out of HCTZ since Friday.  Please call patient at home.  Patient is aware that sharon is not in the office at the moment and she will call back in the morning.

## 2015-03-08 NOTE — Telephone Encounter (Signed)
LMOM to have patient call regarding her refill on Triam HCTZ.

## 2015-03-09 NOTE — Telephone Encounter (Signed)
Tried to call back but line busy x 2.

## 2015-03-11 NOTE — Telephone Encounter (Signed)
lmtcb concerning refill.

## 2015-03-11 NOTE — Telephone Encounter (Signed)
NA x 1

## 2015-03-25 DIAGNOSIS — H5210 Myopia, unspecified eye: Secondary | ICD-10-CM | POA: Diagnosis not present

## 2015-03-25 DIAGNOSIS — H524 Presbyopia: Secondary | ICD-10-CM | POA: Diagnosis not present

## 2015-04-13 ENCOUNTER — Other Ambulatory Visit: Payer: Self-pay | Admitting: Internal Medicine

## 2015-04-13 DIAGNOSIS — Z1231 Encounter for screening mammogram for malignant neoplasm of breast: Secondary | ICD-10-CM

## 2015-04-13 DIAGNOSIS — Z79899 Other long term (current) drug therapy: Secondary | ICD-10-CM | POA: Diagnosis not present

## 2015-04-13 DIAGNOSIS — I10 Essential (primary) hypertension: Secondary | ICD-10-CM | POA: Diagnosis not present

## 2015-04-13 DIAGNOSIS — R002 Palpitations: Secondary | ICD-10-CM | POA: Diagnosis not present

## 2015-04-13 DIAGNOSIS — Z0000 Encounter for general adult medical examination without abnormal findings: Secondary | ICD-10-CM | POA: Diagnosis not present

## 2015-04-14 ENCOUNTER — Ambulatory Visit: Payer: Self-pay

## 2015-04-19 ENCOUNTER — Ambulatory Visit: Payer: Self-pay

## 2015-04-20 ENCOUNTER — Ambulatory Visit: Payer: Self-pay | Admitting: Cardiovascular Disease

## 2015-04-20 ENCOUNTER — Encounter: Payer: Self-pay | Admitting: *Deleted

## 2015-05-27 DIAGNOSIS — E871 Hypo-osmolality and hyponatremia: Secondary | ICD-10-CM | POA: Diagnosis not present

## 2015-07-26 DIAGNOSIS — L989 Disorder of the skin and subcutaneous tissue, unspecified: Secondary | ICD-10-CM | POA: Diagnosis not present

## 2015-08-09 ENCOUNTER — Ambulatory Visit (INDEPENDENT_AMBULATORY_CARE_PROVIDER_SITE_OTHER): Payer: Commercial Managed Care - HMO | Admitting: Podiatry

## 2015-08-09 ENCOUNTER — Ambulatory Visit (INDEPENDENT_AMBULATORY_CARE_PROVIDER_SITE_OTHER): Payer: Commercial Managed Care - HMO | Admitting: Cardiovascular Disease

## 2015-08-09 ENCOUNTER — Encounter: Payer: Self-pay | Admitting: Podiatry

## 2015-08-09 ENCOUNTER — Encounter: Payer: Self-pay | Admitting: Cardiovascular Disease

## 2015-08-09 VITALS — BP 142/82 | HR 69 | Ht 63.0 in | Wt 161.5 lb

## 2015-08-09 VITALS — BP 143/75 | HR 78 | Resp 16 | Ht 63.0 in | Wt 151.0 lb

## 2015-08-09 DIAGNOSIS — I10 Essential (primary) hypertension: Secondary | ICD-10-CM

## 2015-08-09 DIAGNOSIS — E669 Obesity, unspecified: Secondary | ICD-10-CM

## 2015-08-09 DIAGNOSIS — E785 Hyperlipidemia, unspecified: Secondary | ICD-10-CM

## 2015-08-09 DIAGNOSIS — C4491 Basal cell carcinoma of skin, unspecified: Secondary | ICD-10-CM

## 2015-08-09 DIAGNOSIS — R55 Syncope and collapse: Secondary | ICD-10-CM

## 2015-08-09 DIAGNOSIS — R000 Tachycardia, unspecified: Secondary | ICD-10-CM | POA: Diagnosis not present

## 2015-08-09 MED ORDER — METOPROLOL SUCCINATE ER 25 MG PO TB24: 25.0000 mg | ORAL_TABLET | Freq: Every day | ORAL | Status: DC

## 2015-08-09 MED ORDER — PROPRANOLOL HCL 20 MG PO TABS: 20.0000 mg | ORAL_TABLET | Freq: Three times a day (TID) | ORAL | Status: DC | PRN

## 2015-08-09 MED ORDER — METOPROLOL SUCCINATE ER 25 MG PO TB24: 25.0000 mg | ORAL_TABLET | Freq: Two times a day (BID) | ORAL | Status: DC

## 2015-08-09 MED ORDER — POTASSIUM CHLORIDE ER 10 MEQ PO TBCR: 10.0000 meq | EXTENDED_RELEASE_TABLET | Freq: Every day | ORAL | Status: DC

## 2015-08-09 NOTE — Progress Notes (Signed)
Patient ID: Destiny Ramos, female    DOB: 1941-07-27, 74 y.o.   MRN: [id number]  HPI Comments: [name] is a very pleasant 74 year old woman, patient of Dr. Emily Filbert with a history of palpitations and tachycardia, hypertension, hyperlipidemia, fibromyalgia and chronic venous insufficiency who presents for routine followup. Previous episode of near syncope, fall with concussion several years ago She presents for routine followup of her tachycardia  She reports having one episode of tachycardia 2 days ago lasting for 30 minutes. She is getting ready to go to a reunion, had acute onset. Rate was too rapid to measure. Some lightheadedness. She lay down until symptoms resolved, then went to her party once symptoms improved She did not check her blood pressure or heart rate with her blood pressure cuff. She does have similar episodes, typically they do not last very long She has been tolerating metoprolol daily Has an old propranolol prescription but 74 and half years old Review of lab work showing low potassium at times on HCTZ  EKG on today's visit shows normal sinus rhythm with rate 69 bpm, nonspecific T wave abnormality anterolateral leads  Other past medical history Prior Holter for 30 day event monitor showed  no significant arrhythmias, apart from APCs. She reports that she did not have significant symptoms while the monitors was being worn. 30 day monitor was started on April 20, 2010.   In early May 2013, she had orthostatic type symptoms with some heart racing.   Previous normal echocardiogram dated March 28, 2011 that showed normal LV systolic function, mild LVH, mild MR and TR   Total cholesterol 152 several years ago while on a statin.  Most recent cholesterol 226, LDL 141, this was prior to her weight gain  Allergies  Allergen Reactions  . Zithromax [Azithromycin]     Current Outpatient Prescriptions on File Prior to Visit  Medication Sig Dispense Refill  .  albuterol (PROVENTIL HFA;VENTOLIN HFA) 108 (90 BASE) MCG/ACT inhaler Inhale 2 puffs into the lungs every 6 (six) hours as needed.    Marland Kitchen aspirin 81 MG EC tablet Take 81 mg by mouth daily.      . hydrochlorothiazide (MICROZIDE) 12.5 MG capsule Take 1/2 tab po qd for systolic blood pressure over 130--do not take if systolic blood pressure under  130 15 capsule 11   No current facility-administered medications on file prior to visit.    Past Medical History  Diagnosis Date  . Palpitations   . Tachycardia   . Hypertension   . Hyperlipidemia   . Chronic venous insufficiency     with history of phlebitis  . Fibromyalgia     Past Surgical History  Procedure Laterality Date  . Cataract extraction, bilateral      Social History  reports that she has never smoked. She does not have any smokeless tobacco history on file. She reports that she does not drink alcohol or use illicit drugs.  Family History family history includes Cardiomyopathy in her father.   Review of Systems  Constitutional: Negative.   Respiratory: Negative.   Cardiovascular: Negative.   Gastrointestinal: Negative.   Musculoskeletal: Negative.   Neurological: Negative.   Hematological: Negative.   Psychiatric/Behavioral: Negative.   All other systems reviewed and are negative.   BP 142/82 mmHg  Pulse 69  Ht 5\' 3"  (1.6 m)  Wt 161 lb 8 oz (73.256 kg)  BMI 28.62 kg/m2  Physical Exam  Constitutional: She is oriented to person, place, and time. She appears  well-developed and well-nourished.  Obese  HENT:  Head: Normocephalic.  Nose: Nose normal.  Mouth/Throat: Oropharynx is clear and moist.  Eyes: Conjunctivae are normal. Pupils are equal, round, and reactive to light.  Neck: Normal range of motion. Neck supple. No JVD present.  Cardiovascular: Normal rate, regular rhythm, S1 normal, S2 normal, normal heart sounds and intact distal pulses.  Exam reveals no gallop and no friction rub.   No murmur  heard. Pulmonary/Chest: Effort normal and breath sounds normal. No respiratory distress. She has no wheezes. She has no rales. She exhibits no tenderness.  Abdominal: Soft. Bowel sounds are normal. She exhibits no distension. There is no tenderness.  Musculoskeletal: Normal range of motion. She exhibits no edema or tenderness.  Lymphadenopathy:    She has no cervical adenopathy.  Neurological: She is alert and oriented to person, place, and time. Coordination normal.  Skin: Skin is warm and dry. No rash noted. No erythema.  Psychiatric: She has a normal mood and affect. Her behavior is normal. Judgment and thought content normal.    Assessment and Plan  Nursing note and vitals reviewed.

## 2015-08-09 NOTE — Assessment & Plan Note (Signed)
We have encouraged continued exercise, careful diet management in an effort to lose weight. 

## 2015-08-09 NOTE — Assessment & Plan Note (Signed)
Cholesterol management primary care, Dr. Sabra Heck

## 2015-08-09 NOTE — Progress Notes (Signed)
   Subjective:    Patient ID: Destiny Ramos, female    DOB: 12-May-1941, 74 y.o.   MRN: [id number]  HPI: She presents today with her husband for chief complaint of a painful lesion to the lateral aspect of her left ankle. She states that here for several weeks now she saw her primary care provider who suggested topical antibiotic ointment. She states that she gets some burning and stinging from the lesion and denies purulence. She has not seen a dermatologist. She denies trauma.    Review of Systems  Cardiovascular: Positive for palpitations.  Genitourinary: Positive for urgency.  All other systems reviewed and are negative.      Objective:   Physical Exam: 74 year old white female presents today in good spirits vital signs stable alert and oriented 3. Pulses are strongly palpable left area neurologic sensorium is intact. Deep tendon reflexes are intact muscle strength +5 over 5 dorsiflexion plantarflexion and inversion everters of his musculature is intact. Orthopedic evaluation of his face all joints distal to the ankle for range of motion crepitation. Left foot does demonstrate small nodule just posterior to the lateral malleolus and demonstrates an ulcerated center with a dry serosanguineous drainage and obviously has had some bloody drainage as well. The lesion is mildly tender but does not demonstrate any type of infection at this point.        Assessment & Plan:  Highly suspicious for basal cell carcinoma lesion lateral left ankle.  Plan: Recommended that she follow-up with her dermatologist for excision of this lesion. Until she is able to do that she will notify me with concerns and she will continue to use her Bactroban ointment.  Roselind Messier DPM

## 2015-08-09 NOTE — Assessment & Plan Note (Signed)
We have recommended she increase her metoprolol up to 25 mg twice a day in an effort to suppress her arrhythmia. If she continues to have symptoms, would order a 30 day monitor We have refilled propranolol and suggested she take this as needed for breakthrough arrhythmia

## 2015-08-09 NOTE — Assessment & Plan Note (Signed)
Medication changes as above. Blood pressure in the 130 up to 140 range on today's visit even on recheck

## 2015-08-09 NOTE — Patient Instructions (Addendum)
You are doing well.  Please increase the metoprolol up to 1 pill twice a day  Take propranolol as needed for breakthrough tachycardia Also take potassium pill  Please call us if you have new issues that need to be addressed before your next appt.  Your physician wants you to follow-up in: 6 months.  You will receive a reminder letter in the mail two months in advance. If you don't receive a letter, please call our office to schedule the follow-up appointment.  Nonspecific Tachycardia Tachycardia is a faster than normal heartbeat (more than 100 beats per minute). In adults, the heart normally beats between 60 and 100 times a minute. A fast heartbeat may be a normal response to exercise or stress. It does not necessarily mean that something is wrong. However, sometimes when your heart beats too fast it may not be able to pump enough blood to the rest of your body. This can result in chest pain, shortness of breath, dizziness, and even fainting. Nonspecific tachycardia means that the specific cause or pattern of your tachycardia is unknown. CAUSES  Tachycardia may be harmless or it may be due to a more serious underlying cause. Possible causes of tachycardia include:  Exercise or exertion.  Fever.  Pain or injury.  Infection.  Loss of body fluids (dehydration).  Overactive thyroid.  Lack of red blood cells (anemia).  Anxiety and stress.  Alcohol.  Caffeine.  Tobacco products.  Diet pills.  Illegal drugs.  Heart disease. SYMPTOMS  Rapid or irregular heartbeat (palpitations).  Suddenly feeling your heart beating (cardiac awareness).  Dizziness.  Tiredness (fatigue).  Shortness of breath.  Chest pain.  Nausea.  Fainting. DIAGNOSIS  Your caregiver will perform a physical exam and take your medical history. In some cases, a heart specialist (cardiologist) may be consulted. Your caregiver may also order:  Blood tests.  Electrocardiography. This test records  the electrical activity of your heart.  A heart monitoring test. TREATMENT  Treatment will depend on the likely cause of your tachycardia. The goal is to treat the underlying cause of your tachycardia. Treatment methods may include:  Replacement of fluids or blood through an intravenous (IV) tube for moderate to severe dehydration or anemia.  New medicines or changes in your current medicines.  Diet and lifestyle changes.  Treatment for certain infections.  Stress relief or relaxation methods. HOME CARE INSTRUCTIONS   Rest.  Drink enough fluids to keep your urine clear or pale yellow.  Do not smoke.  Avoid:  Caffeine.  Tobacco.  Alcohol.  Chocolate.  Stimulants such as over-the-counter diet pills or pills that help you stay awake.  Situations that cause anxiety or stress.  Illegal drugs such as marijuana, phencyclidine (PCP), and cocaine.  Only take medicine as directed by your caregiver.  Keep all follow-up appointments as directed by your caregiver. SEEK IMMEDIATE MEDICAL CARE IF:   You have pain in your chest, upper arms, jaw, or neck.  You become weak, dizzy, or feel faint.  You have palpitations that will not go away.  You vomit, have diarrhea, or pass blood in your stool.  Your skin is cool, pale, and wet.  You have a fever that will not go away with rest, fluids, and medicine. MAKE SURE YOU:   Understand these instructions.  Will watch your condition.  Will get help right away if you are not doing well or get worse.   This information is not intended to replace advice given to you by your health  care provider. Make sure you discuss any questions you have with your health care provider.   Document Released: 11/16/2004 Document Revised: 01/01/2012 Document Reviewed: 04/23/2015 Elsevier Interactive Patient Education [employer].

## 2015-08-09 NOTE — Assessment & Plan Note (Signed)
Near syncope likely secondary to hypotension in the setting of tachycardia We'll increase metoprolol to help suppress arrhythmia

## 2015-09-14 DIAGNOSIS — L570 Actinic keratosis: Secondary | ICD-10-CM | POA: Diagnosis not present

## 2015-09-14 DIAGNOSIS — D485 Neoplasm of uncertain behavior of skin: Secondary | ICD-10-CM | POA: Diagnosis not present

## 2015-09-14 DIAGNOSIS — C44729 Squamous cell carcinoma of skin of left lower limb, including hip: Secondary | ICD-10-CM | POA: Diagnosis not present

## 2015-10-01 DIAGNOSIS — E782 Mixed hyperlipidemia: Secondary | ICD-10-CM | POA: Diagnosis not present

## 2015-10-01 DIAGNOSIS — I10 Essential (primary) hypertension: Secondary | ICD-10-CM | POA: Diagnosis not present

## 2015-10-01 DIAGNOSIS — C44721 Squamous cell carcinoma of skin of unspecified lower limb, including hip: Secondary | ICD-10-CM | POA: Diagnosis not present

## 2016-02-07 ENCOUNTER — Ambulatory Visit: Payer: Commercial Managed Care - HMO | Admitting: Cardiovascular Disease

## 2016-02-08 ENCOUNTER — Ambulatory Visit: Payer: Commercial Managed Care - HMO | Admitting: Cardiovascular Disease

## 2016-03-06 DIAGNOSIS — A084 Viral intestinal infection, unspecified: Secondary | ICD-10-CM | POA: Diagnosis not present

## 2016-03-06 DIAGNOSIS — R7989 Other specified abnormal findings of blood chemistry: Secondary | ICD-10-CM | POA: Diagnosis not present

## 2016-03-06 DIAGNOSIS — I10 Essential (primary) hypertension: Secondary | ICD-10-CM | POA: Diagnosis not present

## 2016-04-05 ENCOUNTER — Ambulatory Visit: Payer: Commercial Managed Care - HMO | Admitting: Cardiovascular Disease

## 2016-05-03 DIAGNOSIS — R7989 Other specified abnormal findings of blood chemistry: Secondary | ICD-10-CM | POA: Diagnosis not present

## 2016-05-03 DIAGNOSIS — I10 Essential (primary) hypertension: Secondary | ICD-10-CM | POA: Diagnosis not present

## 2016-05-10 ENCOUNTER — Other Ambulatory Visit: Payer: Self-pay | Admitting: Internal Medicine

## 2016-05-10 DIAGNOSIS — Z1231 Encounter for screening mammogram for malignant neoplasm of breast: Secondary | ICD-10-CM

## 2016-05-10 DIAGNOSIS — Z0000 Encounter for general adult medical examination without abnormal findings: Secondary | ICD-10-CM | POA: Diagnosis not present

## 2016-05-10 DIAGNOSIS — R7989 Other specified abnormal findings of blood chemistry: Secondary | ICD-10-CM | POA: Diagnosis not present

## 2016-05-17 DIAGNOSIS — Z0000 Encounter for general adult medical examination without abnormal findings: Secondary | ICD-10-CM | POA: Diagnosis not present

## 2016-05-22 ENCOUNTER — Encounter: Payer: Self-pay | Admitting: *Deleted

## 2016-05-22 ENCOUNTER — Ambulatory Visit: Payer: Commercial Managed Care - HMO | Admitting: Cardiovascular Disease

## 2016-05-22 DIAGNOSIS — R0989 Other specified symptoms and signs involving the circulatory and respiratory systems: Secondary | ICD-10-CM

## 2016-05-24 ENCOUNTER — Other Ambulatory Visit: Payer: Self-pay | Admitting: Internal Medicine

## 2016-05-24 ENCOUNTER — Ambulatory Visit
Admission: RE | Admit: 2016-05-24 | Discharge: 2016-05-24 | Disposition: A | Discharge status: Home / Self Care | Payer: Commercial Managed Care - HMO | Source: Ambulatory Visit | Attending: Internal Medicine | Admitting: Internal Medicine

## 2016-05-24 DIAGNOSIS — Z1231 Encounter for screening mammogram for malignant neoplasm of breast: Secondary | ICD-10-CM | POA: Diagnosis not present

## 2016-08-01 ENCOUNTER — Encounter: Payer: Self-pay | Admitting: Cardiovascular Disease

## 2016-08-01 ENCOUNTER — Ambulatory Visit (INDEPENDENT_AMBULATORY_CARE_PROVIDER_SITE_OTHER): Payer: Commercial Managed Care - HMO | Admitting: Cardiovascular Disease

## 2016-08-01 VITALS — BP 158/84 | HR 72 | Ht 63.0 in | Wt 181.0 lb

## 2016-08-01 DIAGNOSIS — Z23 Encounter for immunization: Secondary | ICD-10-CM

## 2016-08-01 DIAGNOSIS — R000 Tachycardia, unspecified: Secondary | ICD-10-CM | POA: Diagnosis not present

## 2016-08-01 DIAGNOSIS — I10 Essential (primary) hypertension: Secondary | ICD-10-CM | POA: Diagnosis not present

## 2016-08-01 DIAGNOSIS — E7800 Pure hypercholesterolemia, unspecified: Secondary | ICD-10-CM | POA: Diagnosis not present

## 2016-08-01 MED ORDER — ROSUVASTATIN CALCIUM 10 MG PO TABS: 10.0000 mg | ORAL_TABLET | Freq: Every day | ORAL | 11 refills | Status: DC

## 2016-08-01 NOTE — Progress Notes (Signed)
Cardiology Office Note  Date:  08/01/2016   ID:  Destiny, Ramos 12-10-1940, MRN [id number]  PCP:  Rusty Aus, MD   Chief Complaint  Patient presents with  . other    6 month f/u no complaints today. Meds reviewed verbally with pt.    HPI:  Destiny Ramos is a very pleasant 75 year old woman, patient of Dr. Emily Filbert with a history of palpitations and tachycardia, hypertension, hyperlipidemia, fibromyalgia and chronic venous insufficiency who presents for routine followup. Previous episode of near syncope, fall with concussion several years ago She presents for routine followup of her tachycardia  In follow-up today, she reports having rare episodes of tachycardia Typically 20-30 minutes at a time She does not take her metoprolol faithfully, often will skip a dose If she develops tachycardia she will take her metoprolol One episode came on by itself while sitting, lasted 20 minutes Has not had to take propranolol  Lab work reviewed with her Total chol 240, LDL 161 She has indicated she would like a cholesterol medication given family history of coronary disease mother and father  Weight up 30 pounds in the past year She is not exercising, eating out frequently  EKG on today's visit shows normal sinus rhythm with rate 72 bpm, nonspecific T wave abnormality V3 through V6  Other past medical history Prior Holter for 30 day event monitor showed  no significant arrhythmias, apart from APCs. She reports that she did not have significant symptoms while the monitors was being worn. 30 day monitor was started on April 20, 2010.   In early May 2013, she had orthostatic type symptoms with some heart racing.   Previous normal echocardiogram dated March 28, 2011 that showed normal LV systolic function, mild LVH, mild MR and TR   Total cholesterol 152 several years ago while on a statin.  Most recent cholesterol 226, LDL 141, this was prior to her weight gain   PMH:   has  a past medical history of Chronic venous insufficiency; Fibromyalgia; Hyperlipidemia; Hypertension; Palpitations; and Tachycardia.  PSH:    Past Surgical History:  Procedure Laterality Date  . CATARACT EXTRACTION, BILATERAL      Current Outpatient Prescriptions  Medication Sig Dispense Refill  . albuterol (PROVENTIL HFA;VENTOLIN HFA) 108 (90 BASE) MCG/ACT inhaler Inhale 2 puffs into the lungs every 6 (six) hours as needed.    Marland Kitchen aspirin 81 MG EC tablet Take 81 mg by mouth daily.      . hydrochlorothiazide (MICROZIDE) 12.5 MG capsule Take 1/2 tab po qd for systolic blood pressure over 130--do not take if systolic blood pressure under  130 15 capsule 11  . metoprolol succinate (TOPROL-XL) 25 MG 24 hr tablet Take 1 tablet (25 mg total) by mouth 2 (two) times daily. 180 tablet 3  . mupirocin ointment (BACTROBAN) 2 %     . phentermine 15 MG capsule Take 15 mg by mouth every morning.    . potassium chloride (K-DUR) 10 MEQ tablet Take 1 tablet (10 mEq total) by mouth daily. 90 tablet 3  . propranolol (INDERAL) 20 MG tablet Take 1 tablet (20 mg total) by mouth 3 (three) times daily as needed. 90 tablet 6   No current facility-administered medications for this visit.      Allergies:   Zithromax [azithromycin]   Social History:  The patient  reports that she has never smoked. She has never used smokeless tobacco. She reports that she does not drink alcohol or use drugs.  Family History:   family history includes Cardiomyopathy in her father.    Review of Systems: Review of Systems  Constitutional: Negative.        Weight gain  Respiratory: Negative.   Cardiovascular: Negative.   Gastrointestinal: Negative.   Musculoskeletal: Negative.   Neurological: Negative.   Psychiatric/Behavioral: Negative.   All other systems reviewed and are negative.    PHYSICAL EXAM: VS:  BP (!) 158/84 (BP Location: Left Arm, Patient Position: Sitting, Cuff Size: Normal)   Pulse 72   Ht 5\' 3"  (1.6 m)    Wt 181 lb (82.1 kg)   BMI 32.06 kg/m  , BMI Body mass index is 32.06 kg/m. GEN: Well nourished, well developed, in no acute distress , Obese HEENT: normal  Neck: no JVD, carotid bruits, or masses Cardiac: RRR; no murmurs, rubs, or gallops,no edema  Respiratory:  clear to auscultation bilaterally, normal work of breathing GI: soft, nontender, nondistended, + BS MS: no deformity or atrophy  Skin: warm and dry, no rash Neuro:  Strength and sensation are intact Psych: euthymic mood, full affect    Recent Labs: No results found for requested labs within last 8760 hours.    Lipid Panel No results found for: CHOL, HDL, LDLCALC, TRIG    Wt Readings from Last 3 Encounters:  08/01/16 181 lb (82.1 kg)  08/09/15 161 lb 8 oz (73.3 kg)  08/09/15 151 lb (68.5 kg)       ASSESSMENT AND PLAN:  Essential hypertension - Plan: EKG 12-Lead, Hepatic function panel, Lipid Profile Blood pressure elevated on today's visit, improved on recheck, no changes made to her medications  Tachycardia - Plan: EKG 12-Lead, Hepatic function panel, Lipid Profile Noncompliant with her metoprolol, only takes this as needed Continues to have rare episodes of breakthrough tachycardia  Pure hypercholesterolemia - Plan: Hepatic function panel, Lipid Profile She has indicated she would like to start a cholesterol medication. Recommended she start Crestor 10 mg daily  Encounter for immunization - Plan: Flu Vaccine QUAD 36+ mos IM Flu shot provided today  Morbid obesity ([place]) She is currently taking phentermine for weight loss Dietary guide provided Discussed the need to turn her weight around, start exercise program   Total encounter time more than 25 minutes  Greater than 50% was spent in counseling and coordination of care with the patient  Disposition:   F/U  12 months   Orders Placed This Encounter  Procedures  . Flu Vaccine QUAD 36+ mos IM  . Hepatic function panel  . Lipid Profile  . EKG  12-Lead    Signed, Esmond Plants, M.D., Ph.D. 08/01/2016  [place], [place]

## 2016-08-01 NOTE — Patient Instructions (Addendum)
Medication Instructions:   Please start crestor 10 mg daily for high cholesterol  Labwork:  No new labs needed  Testing/Procedures:  No further testing at this time   Follow-Up: It was a pleasure seeing you in the office today. Please call us if you have new issues that need to be addressed before your next appt.  [phone]  Your physician wants you to follow-up in: 12 months.  You will receive a reminder letter in the mail two months in advance. If you don't receive a letter, please call our office to schedule the follow-up appointment.  If you need a refill on your cardiac medications before your next appointment, please call your pharmacy.

## 2016-08-18 DIAGNOSIS — L570 Actinic keratosis: Secondary | ICD-10-CM | POA: Diagnosis not present

## 2016-08-18 DIAGNOSIS — Z808 Family history of malignant neoplasm of other organs or systems: Secondary | ICD-10-CM | POA: Diagnosis not present

## 2016-08-18 DIAGNOSIS — D225 Melanocytic nevi of trunk: Secondary | ICD-10-CM | POA: Diagnosis not present

## 2016-08-18 DIAGNOSIS — Z85828 Personal history of other malignant neoplasm of skin: Secondary | ICD-10-CM | POA: Diagnosis not present

## 2016-09-19 DIAGNOSIS — I872 Venous insufficiency (chronic) (peripheral): Secondary | ICD-10-CM | POA: Diagnosis not present

## 2016-09-19 DIAGNOSIS — L821 Other seborrheic keratosis: Secondary | ICD-10-CM | POA: Diagnosis not present

## 2016-09-19 DIAGNOSIS — D1801 Hemangioma of skin and subcutaneous tissue: Secondary | ICD-10-CM | POA: Diagnosis not present

## 2016-09-19 DIAGNOSIS — L820 Inflamed seborrheic keratosis: Secondary | ICD-10-CM | POA: Diagnosis not present

## 2016-09-19 DIAGNOSIS — L570 Actinic keratosis: Secondary | ICD-10-CM | POA: Diagnosis not present

## 2016-09-19 DIAGNOSIS — D235 Other benign neoplasm of skin of trunk: Secondary | ICD-10-CM | POA: Diagnosis not present

## 2016-10-13 ENCOUNTER — Other Ambulatory Visit: Payer: Self-pay | Admitting: Cardiovascular Disease

## 2016-10-27 ENCOUNTER — Other Ambulatory Visit: Payer: Self-pay | Admitting: Cardiovascular Disease

## 2016-11-03 ENCOUNTER — Other Ambulatory Visit: Payer: Commercial Managed Care - HMO

## 2016-11-06 ENCOUNTER — Other Ambulatory Visit (INDEPENDENT_AMBULATORY_CARE_PROVIDER_SITE_OTHER): Payer: Medicare HMO

## 2016-11-06 ENCOUNTER — Other Ambulatory Visit: Payer: Self-pay | Admitting: *Deleted

## 2016-11-06 DIAGNOSIS — I10 Essential (primary) hypertension: Secondary | ICD-10-CM | POA: Diagnosis not present

## 2016-11-06 DIAGNOSIS — E7800 Pure hypercholesterolemia, unspecified: Secondary | ICD-10-CM

## 2016-11-06 DIAGNOSIS — R000 Tachycardia, unspecified: Secondary | ICD-10-CM

## 2016-11-06 NOTE — Addendum Note (Signed)
Addended by: Britt Bottom on: 11/06/2016 10:08 AM   Modules accepted: Orders

## 2016-11-14 DIAGNOSIS — I10 Essential (primary) hypertension: Secondary | ICD-10-CM | POA: Diagnosis not present

## 2016-11-14 DIAGNOSIS — E782 Mixed hyperlipidemia: Secondary | ICD-10-CM | POA: Diagnosis not present

## 2016-11-14 DIAGNOSIS — Z0000 Encounter for general adult medical examination without abnormal findings: Secondary | ICD-10-CM | POA: Diagnosis not present

## 2017-03-06 ENCOUNTER — Telehealth: Payer: Self-pay | Admitting: Cardiovascular Disease

## 2017-03-06 NOTE — Telephone Encounter (Signed)
Pt calling stating she is having some BP concerns   Lower number and HR is running a bit low  HR is running around 50. To the point where she feels weak   She keeps having these spells, been going on for about a week  Has only had about three spells  Please advise

## 2017-03-06 NOTE — Telephone Encounter (Signed)
Spoke w/ pt.  She is sched to see Dr. Rockey Situ on 03/12/17. Reviewed her meds w/ her and she reports that she did not take her meds as prescribed for awhile, recently resumed her meds. She has been taking propranolol TID every day - not prn. Her BP has been in the 50s, BP 110s/60s. She had an episode in Dec, where [place] was [place] to church for SVT rate of 229, she converted on her own. Advised her to hold propranolol until her appt w/ Dr. Rockey Situ on Monday. She verbalizes understanding and is appreciative of the call.

## 2017-03-06 NOTE — Telephone Encounter (Signed)
She needs an appt for an eval - we haven't seen her in awhile. Can you sched her to come in to see Dr. Rockey Situ, [facility] or [place]?

## 2017-03-10 NOTE — Progress Notes (Signed)
Cardiology Office Note  Date:  03/12/2017   ID:  ADALIE MAND, DOB 04-Mar-1941, MRN [id number]  PCP:  Rusty Aus, MD   Chief Complaint  Patient presents with  . other    Pt. c/o dizziness and decreased HR. Meds reviewed by the pt. verbally.     HPI:   Ms. Cura is a very pleasant 76 year old woman,  with a history of  palpitations and tachycardia, hypertension,  Hyperlipidemia, fibromyalgia chronic venous insufficiency  Previous episode of near syncope, fall with concussion several years ago She presents for routine followup of her tachycardia  10/07/[place] SVT with EMTs. Rate 220s Boke on its own  One week ago,  SVT, near syncope, 2 min  She has been having this tachycardia on a more regular basis Often will break on its own, lasting several minutes Previously was skipping doses of her metoprolol  EKG personally reviewed by myself on todays visit Shows normal sinus rhythm with rate 82 bpm diffuse T-wave abnormality precordial leads, 3 and aVF   She is not exercising, eating out frequently  Lab work reviewed with her  previous lab work Total chol 240, LDL 161 After starting cholesterol education total cholesterol 183, LDL 109    Other past medical history Prior Holter for 30 day event monitor showed  no significant arrhythmias, apart from APCs. She reports that she did not have significant symptoms while the monitors was being worn. 30 day monitor was started on April 20, 2010.   In early May 2013, she had orthostatic type symptoms with some heart racing.   Previous normal echocardiogram dated March 28, 2011 that showed normal LV systolic function, mild LVH, mild MR and TR    PMH:   has a past medical history of Chronic venous insufficiency; Fibromyalgia; Hyperlipidemia; Hypertension; Palpitations; and Tachycardia.  PSH:    Past Surgical History:  Procedure Laterality Date  . CATARACT EXTRACTION, BILATERAL      Current  Outpatient Prescriptions  Medication Sig Dispense Refill  . albuterol (PROVENTIL HFA;VENTOLIN HFA) 108 (90 BASE) MCG/ACT inhaler Inhale 2 puffs into the lungs every 6 (six) hours as needed.    Marland Kitchen aspirin 81 MG EC tablet Take 81 mg by mouth daily.      . hydrochlorothiazide (MICROZIDE) 12.5 MG capsule Take 1/2 tab po qd for systolic blood pressure over 130--do not take if systolic blood pressure under  130 15 capsule 11  . metoprolol succinate (TOPROL-XL) 25 MG 24 hr tablet Take 1 tablet (25 mg total) by mouth 2 (two) times daily. 180 tablet 3  . mupirocin ointment (BACTROBAN) 2 %     . phentermine 15 MG capsule Take 15 mg by mouth every morning.    . potassium chloride (K-DUR) 10 MEQ tablet Take 1 tablet (10 mEq total) by mouth daily. 90 tablet 3  . propranolol (INDERAL) 20 MG tablet Take 1 tablet (20 mg total) by mouth 3 (three) times daily as needed. 90 tablet 6   No current facility-administered medications for this visit.      Allergies:   Zithromax [azithromycin]   Social History:  The patient  reports that she has never smoked. She has never used smokeless tobacco. She reports that she does not drink alcohol or use drugs.   Family History:   family history includes Cardiomyopathy in her father.    Review of Systems: Review of Systems  Constitutional: Negative.        Weight gain  Respiratory: Negative.  Cardiovascular: Positive for palpitations.       Tachycardia  Gastrointestinal: Negative.   Musculoskeletal: Negative.   Neurological: Negative.   Psychiatric/Behavioral: Negative.   All other systems reviewed and are negative.    PHYSICAL EXAM: VS:  BP 130/60 (BP Location: Left Arm, Patient Position: Sitting, Cuff Size: Large)   Pulse 82   Ht 5\' 3"  (1.6 m)   Wt 180 lb 8 oz (81.9 kg)   BMI 31.97 kg/m  , BMI Body mass index is 31.97 kg/m.  GEN: Well nourished, well developed, in no acute distress , Obese HEENT: normal  Neck: no JVD, carotid bruits, or  masses Cardiac: RRR; no murmurs, rubs, or gallops,no edema  Respiratory:  clear to auscultation bilaterally, normal work of breathing GI: soft, nontender, nondistended, + BS MS: no deformity or atrophy  Skin: warm and dry, no rash Neuro:  Strength and sensation are intact Psych: euthymic mood, full affect    Recent Labs: No results found for requested labs within last 8760 hours.    Lipid Panel No results found for: CHOL, HDL, LDLCALC, TRIG    Wt Readings from Last 3 Encounters:  03/12/17 180 lb 8 oz (81.9 kg)  08/01/16 181 lb (82.1 kg)  08/09/15 161 lb 8 oz (73.3 kg)       ASSESSMENT AND PLAN:    SVT  Documented by EMTs, episode at church, terminated on its own  She does not want 30 day monitor at this time to document  We have recommended that she start diltiazem extended release 120 mg daily  Discussed carotid sinus the size and Valsalva with her If she develops leg swelling from diltiazem, may need to change to antiarrhythmic or refer to EP for ablation  Essential hypertension -  recommended that she stop her HCTZ and potassium  We will add diltiazem for arrhythmia above   Tachycardia -  Likely SVT, recommended she stay on her metoprolol twice a day, diltiazem   Pure hypercholesterolemia -  Improvement in her cholesterol numbers, recommended she stay on her statin   Morbid obesity ([place] Previously taking phentermine for weight loss Discussed the need to turn her weight around, start exercise program   Total encounter time more than 25 minutes  Greater than 50% was spent in counseling and coordination of care with the patient  Disposition:   F/U  3 months   Orders Placed This Encounter  Procedures  . EKG 12-Lead    Signed, Esmond Plants, M.D., Ph.D. 03/12/2017  [place], [place]

## 2017-03-12 ENCOUNTER — Encounter: Payer: Self-pay | Admitting: Cardiovascular Disease

## 2017-03-12 ENCOUNTER — Ambulatory Visit (INDEPENDENT_AMBULATORY_CARE_PROVIDER_SITE_OTHER): Payer: Medicare HMO | Admitting: Cardiovascular Disease

## 2017-03-12 VITALS — BP 130/60 | HR 82 | Ht 63.0 in | Wt 180.5 lb

## 2017-03-12 DIAGNOSIS — I10 Essential (primary) hypertension: Secondary | ICD-10-CM | POA: Diagnosis not present

## 2017-03-12 DIAGNOSIS — I471 Supraventricular tachycardia: Secondary | ICD-10-CM | POA: Diagnosis not present

## 2017-03-12 DIAGNOSIS — E782 Mixed hyperlipidemia: Secondary | ICD-10-CM

## 2017-03-12 DIAGNOSIS — R55 Syncope and collapse: Secondary | ICD-10-CM

## 2017-03-12 DIAGNOSIS — I951 Orthostatic hypotension: Secondary | ICD-10-CM | POA: Diagnosis not present

## 2017-03-12 DIAGNOSIS — R000 Tachycardia, unspecified: Secondary | ICD-10-CM | POA: Diagnosis not present

## 2017-03-12 MED ORDER — DILTIAZEM HCL ER COATED BEADS 120 MG PO CP24: 120.0000 mg | ORAL_CAPSULE | Freq: Every day | ORAL | 3 refills | Status: DC

## 2017-03-12 MED ORDER — POTASSIUM CHLORIDE ER 10 MEQ PO TBCR: 10.0000 meq | EXTENDED_RELEASE_TABLET | ORAL | 3 refills | Status: DC | PRN

## 2017-03-12 NOTE — Patient Instructions (Addendum)
Medication Instructions:   Please stop the HCTZ and the potassium  Start diltiazem ER 120 mg one a day Stay on metoprolol  Call if you get leg swelling We would try alternate medication  Labwork:  No new labs needed  Testing/Procedures:  No further testing at this time   I recommend watching educational videos on topics of interest to you at:       www.goemmi.com  Enter code: [email or web address]    Follow-Up: It was a pleasure seeing you in the office today. Please call us if you have new issues that need to be addressed before your next appt.  [phone]  Your physician wants you to follow-up in: 3 months.  You will receive a reminder letter in the mail two months in advance. If you don't receive a letter, please call our office to schedule the follow-up appointment.  If you need a refill on your cardiac medications before your next appointment, please call your pharmacy.

## 2017-03-13 ENCOUNTER — Telehealth: Payer: Self-pay | Admitting: Cardiovascular Disease

## 2017-03-13 NOTE — Telephone Encounter (Signed)
Pt states she is confused about her Potassium and HCTZ. She asks if she was to stop the potassium, or take with HCTZ. Please call and advise.

## 2017-03-13 NOTE — Telephone Encounter (Signed)
Returned call to patient. Reviewed AVS from OV with Dr Rockey Situ yesterday: "Medication Instructions:   Please stop the HCTZ and the potassium  Start diltiazem ER 120 mg one a day Stay on metoprolol  Call if you get leg swelling We would try alternate medication"  Patient had the AVS with her as well. She verbalized understanding.

## 2017-03-15 ENCOUNTER — Telehealth: Payer: Self-pay | Admitting: Cardiovascular Disease

## 2017-03-15 NOTE — Telephone Encounter (Signed)
Pt calling stating that she just started the Diltiazem  And she reports to have some Swelling   It is just getting started but was told to call us if this happened  Please advise.

## 2017-03-15 NOTE — Telephone Encounter (Signed)
Spoke with patient and she reports that she started Diltiazem on Monday and noticed last night she had a little swelling and today she is able to feel the tightness. Let her know that I would send this message to Dr. Rockey Situ and will call back with further recommendations. She verbalized understanding and had no further questions at this time.

## 2017-03-16 NOTE — Telephone Encounter (Signed)
Pt is calling back regarding her rx being called in for her swelling. Please advise of status.

## 2017-03-16 NOTE — Telephone Encounter (Signed)
Spoke with patient and she was just calling back to see if she needs to take something different because this medication is causing her some swelling. Let her know that I sent it over to Dr. Rockey Situ and we are just waiting on his response. She verbalized understanding and had no further questions at this time.

## 2017-03-16 NOTE — Telephone Encounter (Signed)
Reviewed Dr. Donivan Scull recommendations with patient. Instructed her to take the HCTZ and Potassium as needed for leg swelling. She felt that would take care of it and she did not need any refills at this time. Instructed her to please call back if swelling persists or worsens. She verbalized understanding of our conversation, agreement with plan, and had no further questions at this time.

## 2017-03-16 NOTE — Telephone Encounter (Signed)
We also stop the HCTZ which could explain the swelling Would take HCTZ and potassium as needed for leg swelling Need to check blood pressure measurements to make sure it is not low If swelling persists, options would include going back on metoprolol, changing to a antiarrhythmic medication with metoprolol or meeting with electrophysiology Dr. for possible ablation of her SVT

## 2017-03-21 DIAGNOSIS — H6123 Impacted cerumen, bilateral: Secondary | ICD-10-CM | POA: Diagnosis not present

## 2017-05-29 ENCOUNTER — Other Ambulatory Visit: Payer: Self-pay | Admitting: Internal Medicine

## 2017-05-29 DIAGNOSIS — Z1231 Encounter for screening mammogram for malignant neoplasm of breast: Secondary | ICD-10-CM

## 2017-06-11 DIAGNOSIS — I471 Supraventricular tachycardia: Secondary | ICD-10-CM | POA: Insufficient documentation

## 2017-06-11 NOTE — Progress Notes (Signed)
Cardiology Office Note  Date:  06/12/2017   ID:  Destiny Ramos, Destiny Ramos May 18, 1941, MRN [id number]  PCP:  Rusty Aus, MD   Chief Complaint  Patient presents with  . other    3 month follow up. Meds reviewed by the pt. verbally. Pt. c/o several episodes of SVT.     HPI:  Destiny Ramos is a very pleasant 76 year old woman,  with a history of  palpitations and tachycardia, SVT hypertension,  Hyperlipidemia, fibromyalgia chronic venous insufficiency  Nonsmoker glucose elevated Previous episode of near syncope, fall with concussion several years ago She presents for routine followup of her tachycardia   In follow-up today she reports she is doing well, She has only had2 episodes of tachycardia Short spells  Stopped the statin on her own First to do it naturally Previous total chol 183 Weight up  last year, stable over the past several months She does not watch her diet  Lab work reviewed with her  previous lab work Total chol 240, LDL 161 After starting cholesterol education total cholesterol 183, LDL 109  Now off the statin  10/07/[place] SVT with [place]s. Rate 220s Broke on its own  EKG personally reviewed by myself on todays visit Shows normal sinus rhythm with rate 88 bpm diffuse T-wave abnormality precordial leads, 3 and aVF . No significant change  Other past medical history Prior Holter for 30 day event monitor showed  no significant arrhythmias, apart from APCs. She reports that she did not have significant symptoms while the monitors was being worn. 30 day monitor was started on April 20, 2010.   In early May 2013, she had orthostatic type symptoms with some heart racing.   Previous normal echocardiogram dated March 28, 2011 that showed normal LV systolic function, mild LVH, mild MR and TR    PMH:   has a past medical history of Chronic venous insufficiency; Fibromyalgia; Hyperlipidemia; Hypertension; Palpitations; and Tachycardia.  PSH:     Past Surgical History:  Procedure Laterality Date  . CATARACT EXTRACTION, BILATERAL      Current Outpatient Prescriptions  Medication Sig Dispense Refill  . albuterol (PROVENTIL HFA;VENTOLIN HFA) 108 (90 BASE) MCG/ACT inhaler Inhale 2 puffs into the lungs every 6 (six) hours as needed.    Marland Kitchen aspirin 81 MG EC tablet Take 81 mg by mouth daily.      . hydrochlorothiazide (MICROZIDE) 12.5 MG capsule Take 1/2 tab po qd for systolic blood pressure over 130--do not take if systolic blood pressure under  130 15 capsule 11  . metoprolol succinate (TOPROL-XL) 25 MG 24 hr tablet Take 1 tablet (25 mg total) by mouth 2 (two) times daily. 180 tablet 3  . mupirocin ointment (BACTROBAN) 2 %     . phentermine 15 MG capsule Take 15 mg by mouth every morning.    . potassium chloride (K-DUR) 10 MEQ tablet Take 1 tablet (10 mEq total) by mouth daily. 90 tablet 3  . propranolol (INDERAL) 20 MG tablet Take 1 tablet (20 mg total) by mouth 3 (three) times daily as needed. 90 tablet 6   No current facility-administered medications for this visit.      Allergies:   Zithromax [azithromycin]   Social History:  The patient  reports that she has never smoked. She has never used smokeless tobacco. She reports that she does not drink alcohol or use drugs.   Family History:   family history includes Cardiomyopathy in her father.    Review of  Systems: Review of Systems  Constitutional: Negative.        Weight gain  Respiratory: Negative.   Cardiovascular: Negative.        Tachycardia  Gastrointestinal: Negative.   Musculoskeletal: Negative.   Neurological: Negative.   Psychiatric/Behavioral: Negative.   All other systems reviewed and are negative.    PHYSICAL EXAM: VS:  BP 140/76 (BP Location: Left Arm, Patient Position: Sitting, Cuff Size: Large)   Pulse 88   Ht 5\' 3"  (1.6 m)   Wt 182 lb 4 oz (82.7 kg)   BMI 32.28 kg/m  , BMI Body mass index is 32.28 kg/m.  GEN: Well nourished, well developed, in  no acute distress , Obese HEENT: normal  Neck: no JVD, carotid bruits, or masses Cardiac: RRR; no murmurs, rubs, or gallops,no edema  Respiratory:  clear to auscultation bilaterally, normal work of breathing GI: soft, nontender, nondistended, + BS MS: no deformity or atrophy  Skin: warm and dry, no rash Neuro:  Strength and sensation are intact Psych: euthymic mood, full affect    Recent Labs: No results found for requested labs within last 8760 hours.    Lipid Panel No results found for: CHOL, HDL, LDLCALC, TRIG    Wt Readings from Last 3 Encounters:  06/12/17 182 lb 4 oz (82.7 kg)  03/12/17 180 lb 8 oz (81.9 kg)  08/01/16 181 lb (82.1 kg)       ASSESSMENT AND PLAN:  SVT  Documented by EMTs, episode at church, terminated on its own  Very rare episodes, no change to her medication regiment Recommended she call us if symptoms get worse  Essential hypertension - Blood pressure is well controlled on today's visit. No changes made to the medications. Recommended weight loss, lifestyle changes  Pure hypercholesterolemia -  She does not want a statin at this time Was doing well on the medications  Morbid obesity ([place] Previously taking phentermine for weight loss Poor diet, no exercise Stressed importance of lifestyle changes. "If I cook it I want to eat it"   Total encounter time more than 25 minutes  Greater than 50% was spent in counseling and coordination of care with the patient  Disposition:   F/U  12 months   Orders Placed This Encounter  Procedures  . EKG 12-Lead    Signed, Esmond Plants, M.D., Ph.D. 06/12/2017  [place], [place]

## 2017-06-12 ENCOUNTER — Ambulatory Visit (INDEPENDENT_AMBULATORY_CARE_PROVIDER_SITE_OTHER): Payer: Medicare HMO | Admitting: Cardiovascular Disease

## 2017-06-12 ENCOUNTER — Encounter: Payer: Self-pay | Admitting: Cardiovascular Disease

## 2017-06-12 VITALS — BP 140/76 | HR 88 | Ht 63.0 in | Wt 182.2 lb

## 2017-06-12 DIAGNOSIS — R000 Tachycardia, unspecified: Secondary | ICD-10-CM

## 2017-06-12 DIAGNOSIS — E782 Mixed hyperlipidemia: Secondary | ICD-10-CM

## 2017-06-12 DIAGNOSIS — I471 Supraventricular tachycardia: Secondary | ICD-10-CM | POA: Diagnosis not present

## 2017-06-12 DIAGNOSIS — I10 Essential (primary) hypertension: Secondary | ICD-10-CM

## 2017-06-12 DIAGNOSIS — R55 Syncope and collapse: Secondary | ICD-10-CM

## 2017-06-12 NOTE — Patient Instructions (Addendum)

## 2017-09-02 DIAGNOSIS — R21 Rash and other nonspecific skin eruption: Secondary | ICD-10-CM | POA: Diagnosis not present

## 2017-11-07 DIAGNOSIS — Z0100 Encounter for examination of eyes and vision without abnormal findings: Secondary | ICD-10-CM | POA: Diagnosis not present

## 2017-11-07 DIAGNOSIS — H524 Presbyopia: Secondary | ICD-10-CM | POA: Diagnosis not present

## 2017-11-26 ENCOUNTER — Other Ambulatory Visit: Payer: Self-pay | Admitting: *Deleted

## 2017-11-26 ENCOUNTER — Telehealth: Payer: Self-pay | Admitting: Cardiovascular Disease

## 2017-11-26 NOTE — Telephone Encounter (Signed)
°*  STAT* If patient is at the pharmacy, call can be transferred to refill team.   1. Which medications need to be refilled? (please list name of each medication and dose if known) metoprolol succinate 1 tablet twice daily  2. Which pharmacy/location (including street and city if local pharmacy) is medication to be sent to? [place] [facility]   3. Do they need a 30 day or 90 day supply? 90 day

## 2017-11-26 NOTE — Telephone Encounter (Signed)
Unable to leave message. I called to [facility].

## 2017-11-27 ENCOUNTER — Other Ambulatory Visit: Payer: Self-pay | Admitting: *Deleted

## 2017-11-27 ENCOUNTER — Other Ambulatory Visit: Payer: Self-pay

## 2017-11-27 MED ORDER — METOPROLOL SUCCINATE ER 25 MG PO TB24: 25.0000 mg | ORAL_TABLET | Freq: Two times a day (BID) | ORAL | 3 refills | Status: DC

## 2017-11-27 NOTE — Telephone Encounter (Signed)
[facility]

## 2017-11-27 NOTE — Telephone Encounter (Signed)
Pt states she used [facility], [facility]

## 2017-11-27 NOTE — Telephone Encounter (Signed)
rx needs to be sent to [place]

## 2017-11-27 NOTE — Telephone Encounter (Signed)
No VM x2 to contact pt to confirm pharmacy location.

## 2017-11-28 ENCOUNTER — Other Ambulatory Visit: Payer: Self-pay | Admitting: *Deleted

## 2017-11-28 MED ORDER — METOPROLOL SUCCINATE ER 25 MG PO TB24: 25.0000 mg | ORAL_TABLET | Freq: Two times a day (BID) | ORAL | 3 refills | Status: DC

## 2018-01-28 ENCOUNTER — Other Ambulatory Visit: Payer: Self-pay | Admitting: Internal Medicine

## 2018-01-28 DIAGNOSIS — Z1231 Encounter for screening mammogram for malignant neoplasm of breast: Secondary | ICD-10-CM

## 2018-03-06 ENCOUNTER — Ambulatory Visit
Admission: RE | Admit: 2018-03-06 | Discharge: 2018-03-06 | Disposition: A | Discharge status: Home / Self Care | Payer: Medicare HMO | Source: Ambulatory Visit | Attending: Internal Medicine | Admitting: Internal Medicine

## 2018-03-06 DIAGNOSIS — Z1231 Encounter for screening mammogram for malignant neoplasm of breast: Secondary | ICD-10-CM | POA: Insufficient documentation

## 2018-03-06 DIAGNOSIS — R928 Other abnormal and inconclusive findings on diagnostic imaging of breast: Secondary | ICD-10-CM | POA: Diagnosis not present

## 2018-03-07 ENCOUNTER — Other Ambulatory Visit: Payer: Self-pay | Admitting: Internal Medicine

## 2018-03-07 DIAGNOSIS — N6320 Unspecified lump in the left breast, unspecified quadrant: Secondary | ICD-10-CM

## 2018-03-07 DIAGNOSIS — R928 Other abnormal and inconclusive findings on diagnostic imaging of breast: Secondary | ICD-10-CM

## 2018-03-11 ENCOUNTER — Other Ambulatory Visit: Payer: Self-pay | Admitting: Internal Medicine

## 2018-03-11 DIAGNOSIS — N6320 Unspecified lump in the left breast, unspecified quadrant: Secondary | ICD-10-CM

## 2018-03-11 DIAGNOSIS — R928 Other abnormal and inconclusive findings on diagnostic imaging of breast: Secondary | ICD-10-CM

## 2018-03-20 ENCOUNTER — Ambulatory Visit
Admission: RE | Admit: 2018-03-20 | Discharge: 2018-03-20 | Disposition: A | Discharge status: Home / Self Care | Payer: Medicare HMO | Source: Ambulatory Visit | Attending: Internal Medicine | Admitting: Internal Medicine

## 2018-03-20 DIAGNOSIS — N6320 Unspecified lump in the left breast, unspecified quadrant: Secondary | ICD-10-CM | POA: Diagnosis present

## 2018-03-20 DIAGNOSIS — R928 Other abnormal and inconclusive findings on diagnostic imaging of breast: Secondary | ICD-10-CM | POA: Diagnosis present

## 2018-05-17 ENCOUNTER — Telehealth: Payer: Self-pay | Admitting: Cardiovascular Disease

## 2018-05-17 NOTE — Telephone Encounter (Signed)
S/w patient. She's had increased bilateral lower extremity edema for about 1 week. Its gotten worse since Monday. Her legs feel "tight." She states she has avoided high salt foods and has tried to stay hydrated. She last saw Dr Rockey Situ 05/2017. In March 2019, she saw her PCP Dr Lamonte Sakai who discontinued her triamterene/HCTZ and just left her on HCTZ 25 mg daily. Patient ran out of the HCTZ this past Monday and has not called Dr Humphrey Rolls for a refill yet. Advised patient that she would need to call Dr Humphrey Rolls for refill and advice as Dr Humphrey Rolls was the last provider to see patient and made the med changes. Advised if she does not hear anything from Dr Humphrey Rolls to please call us back to help address the issue. She is scheduled to see Dr Rockey Situ on 06/06/18. Patient's med list updated.

## 2018-05-17 NOTE — Telephone Encounter (Signed)
Pt c/o swelling: STAT is pt has developed SOB within 24 hours  1) How much weight have you gained and in what time span? 10-15 lbs since last visit   2) If swelling, where is the swelling located? BLE up to knees   3) Are you currently taking a fluid pill? No   4) Are you currently SOB? Yes heaviness in legs swollen and sore in knees hard to walk on onset a month or so ago  5) Do you have a log of your daily weights (if so, list)? no  6) Have you gained 3 pounds in a day or 5 pounds in a week? Not sure   7) Have you traveled recently? No   Patient says Dr. Lamonte Sakai in March and her bp meds  were changed because of kidney function  triam /HCTZ stopped and changed to hctz 25 mg po q day  Patient is out and the last time it was taken was Monday

## 2018-06-06 ENCOUNTER — Ambulatory Visit: Payer: Medicare HMO | Admitting: Cardiovascular Disease

## 2018-06-06 ENCOUNTER — Encounter: Payer: Self-pay | Admitting: Cardiovascular Disease

## 2018-06-06 VITALS — BP 180/92 | HR 67 | Ht 63.0 in | Wt 190.0 lb

## 2018-06-06 DIAGNOSIS — I10 Essential (primary) hypertension: Secondary | ICD-10-CM | POA: Diagnosis not present

## 2018-06-06 DIAGNOSIS — I471 Supraventricular tachycardia: Secondary | ICD-10-CM

## 2018-06-06 DIAGNOSIS — R55 Syncope and collapse: Secondary | ICD-10-CM

## 2018-06-06 DIAGNOSIS — E782 Mixed hyperlipidemia: Secondary | ICD-10-CM

## 2018-06-06 MED ORDER — POTASSIUM CHLORIDE ER 10 MEQ PO TBCR: 10.0000 meq | EXTENDED_RELEASE_TABLET | Freq: Every day | ORAL | 3 refills | Status: AC

## 2018-06-06 MED ORDER — HYDROCHLOROTHIAZIDE 25 MG PO TABS: 25.0000 mg | ORAL_TABLET | Freq: Every day | ORAL | 3 refills | Status: DC

## 2018-06-06 MED ORDER — METOPROLOL SUCCINATE ER 25 MG PO TB24: 25.0000 mg | ORAL_TABLET | Freq: Two times a day (BID) | ORAL | 3 refills | Status: DC

## 2018-06-06 MED ORDER — ROSUVASTATIN CALCIUM 5 MG PO TABS: 5.0000 mg | ORAL_TABLET | Freq: Every day | ORAL | 3 refills | Status: DC

## 2018-06-06 NOTE — Progress Notes (Signed)
Cardiology Office Note  Date:  06/06/2018   ID:  Destiny Ramos, DOB 11/08/1940, MRN [id number]  PCP:  Perrin Maltese, MD   Chief Complaint  Patient presents with  . Other    12 month follow up. Patient c/o SOB and swelling in ankles. Meds reviewed verbally with patient.     HPI:  Destiny Ramos is a very pleasant 77 year old woman,  with a history of  palpitations and tachycardia, SVT hypertension,  Hyperlipidemia, fibromyalgia chronic venous insufficiency  Nonsmoker glucose elevated Previous episode of near syncope, fall with concussion several years ago She presents for routine followup of her tachycardia  Worsening leg swelling without HCTZ  On crestor, running out Legs are getting weaker No regular exercise program High carbohydrate diet  Checked her blood pressure at home was 140 before coming in to the office today High today in the office Very rare short episodes of tachycardia nothing significant  HCTZ recently held Reports legs are more swollen without the medication  Previously  Stopped the statin on her own Primary care started Crestor 20 mg daily which she reports having myalgias Taking fish oil  Lab work reviewed with hershowing creatinine 1.2 normal potassium total cholesterol 270 LDL 194 normal TSH low vitamin D hemoglobin A1c 6.7  EKG personally reviewed by myself on todays visit Normal sinus rhythm rate 67 bpm right bundle branch block  Other past medical history reviewed 10/07/[place] SV[place] with [place]s. Rate 220s Broke on its own  Prior Holter for 30 day event monitor showed  no significant arrhythmias, apart from APCs. She reports that she did not have significant symptoms while the monitors was being worn. 30 day monitor was started on April 20, 2010.   In early May 2013, she had orthostatic type symptoms with some heart racing.   Previous normal echocardiogram dated March 28, 2011 that showed normal LV systolic function,  mild LVH, mild MR and TR    PMH:   has a past medical history of Chronic venous insufficiency, Fibromyalgia, Hyperlipidemia, Hypertension, Palpitations, and Tachycardia.  PSH:    Past Surgical History:  Procedure Laterality Date  . CATARACT EXTRACTION, BILATERAL     Allergies as of 06/06/2018      Reactions   Zithromax [azithromycin]       Medication List        Accurate as of 06/06/18  1:56 PM. Always use your most recent med list.          aspirin 81 MG EC tablet Take 81 mg by mouth daily.   hydrochlorothiazide 25 MG tablet Commonly known as:  HYDRODIURIL Take 1 tablet (25 mg total) by mouth daily.   metoprolol succinate 25 MG 24 hr tablet Commonly known as:  TOPROL-XL Take 1 tablet (25 mg total) by mouth 2 (two) times daily.   potassium chloride 10 MEQ tablet Commonly known as:  K-DUR Take 1 tablet (10 mEq total) by mouth daily.   propranolol 20 MG tablet Commonly known as:  INDERAL TAKE ONE TABLET THREE TIMES A DAY AS NEEDED   rosuvastatin 5 MG tablet Commonly known as:  CRESTOR Take 1 tablet (5 mg total) by mouth daily.        Allergies:   Zithromax [azithromycin]   Social History:  The patient  reports that she has never smoked. She has never used smokeless tobacco. She reports that she does not drink alcohol or use drugs.   Family History:   family history includes Cardiomyopathy in  her father.    Review of Systems: Review of Systems  Constitutional: Negative.        Weight gain  Respiratory: Negative.   Cardiovascular: Positive for leg swelling.       Tachycardia  Gastrointestinal: Negative.   Musculoskeletal: Negative.   Neurological: Negative.   Psychiatric/Behavioral: Negative.   All other systems reviewed and are negative.    PHYSICAL EXAM: VS:  BP (!) 180/92 (BP Location: Left Arm, Patient Position: Sitting, Cuff Size: Normal)   Pulse 67   Ht 5\' 3"  (1.6 m)   Wt 190 lb (86.2 kg)   BMI 33.66 kg/m  , BMI Body mass index is 33.66  kg/m.  Constitutional:  oriented to person, place, and time. No distress.  HENT:  Head: Normocephalic and atraumatic.  Eyes:  no discharge. No scleral icterus.  Neck: Normal range of motion. Neck supple. No JVD present.  Cardiovascular: Normal rate, regular rhythm, normal heart sounds and intact distal pulses. Exam reveals no gallop and no friction rub. No edema No murmur heard. Pulmonary/Chest: Effort normal and breath sounds normal. No stridor. No respiratory distress.  no wheezes.  no rales.  no tenderness.  Abdominal: Soft.  no distension.  no tenderness.  Musculoskeletal: Normal range of motion.  no  tenderness or deformity.  Neurological:  normal muscle tone. Coordination normal. No atrophy Skin: Skin is warm and dry. No rash noted. not diaphoretic.  Psychiatric:  normal mood and affect. behavior is normal. Thought content normal.     Recent Labs: No results found for requested labs within last 8760 hours.    Lipid Panel No results found for: CHOL, HDL, LDLCALC, TRIG    Wt Readings from Last 3 Encounters:  06/06/18 190 lb (86.2 kg)  06/12/17 182 lb 4 oz (82.7 kg)  03/12/17 180 lb 8 oz (81.9 kg)       ASSESSMENT AND PLAN:  SVT  Documented by EMTs, episode at church, terminated on its own  Very rare episodes,  No changes to her medications no further workup needed  Essential hypertension - Blood pressure markedly elevated on today's visit Weight is up 10 pounds from clinic visit last year Recommended low carbohydrate diet We'll restart HCTZ given worsening leg swelling and blood pressure  Pure hypercholesterolemia -  previously did not want a statin She has Crestor 20 but reports it causes myalgias We will restart Crestor at 5 mg daily  Morbid obesity ([place] Previously taking phentermine for weight loss Poor diet, no exercise Stressed importance of a walking program given her leg weakness   Total encounter time more than 25 minutes  Greater than 50% was  spent in counseling and coordination of care with the patient  Disposition:   F/U  12 months   Orders Placed This Encounter  Procedures  . EKG 12-Lead    Signed, Esmond Plants, M.D., Ph.D. 06/06/2018  [place], [place]

## 2018-06-06 NOTE — Patient Instructions (Addendum)
Medication Instructions:   Please restart the HCTZ one a day Monitor BP at home  Stay on crestor, change to 5 mg daily  Your primary care can refill these medications  Labwork:  No new labs needed  Testing/Procedures:  No further testing at this time   Follow-Up: It was a pleasure seeing you in the office today. Please call us if you have new issues that need to be addressed before your next appt.  [phone]  Your physician wants you to follow-up in: 12 months.  You will receive a reminder letter in the mail two months in advance. If you don't receive a letter, please call our office to schedule the follow-up appointment.  If you need a refill on your cardiac medications before your next appointment, please call your pharmacy.  For educational health videos Log in to : [email or web address] Or : [email or web address], password : [email or web address]

## 2018-06-07 ENCOUNTER — Telehealth: Payer: Self-pay | Admitting: Cardiovascular Disease

## 2018-06-07 NOTE — Telephone Encounter (Signed)
Patient says she's been taking HCTZ 25 mg daily since May. But came off the Triamterene/HCTZ this past May. She says it is not working for her swelling and thinks she needs to go back on it. Advised patient that Dr Rockey Situ did not prescribe her to restart triamterene/HCTZ. She states she is swollen all the way up to her waist and that she needs something else.  She just saw Dr Rockey Situ yesterday. Will route to Dr Rockey Situ.

## 2018-06-07 NOTE — Telephone Encounter (Signed)
°*  STAT* If patient is at the pharmacy, call can be transferred to refill team.   1. Which medications need to be refilled? (please list name of each medication and dose if known) Triamterene   2. Which pharmacy/location (including street and city if local pharmacy) is medication to be sent to? [facility]   3. Do they need a 30 day or 90 day supply?   Patient calling to check on status

## 2018-06-07 NOTE — Telephone Encounter (Signed)
Please advise Triamterene not listed on pt's med list. Pt told to start but dosage and directions not specific on last OV note or medication was not added or sent to local pharmacy.

## 2018-06-07 NOTE — Telephone Encounter (Signed)
New Message  Pt c/o medication issue:  1. Name of Medication: priamterene  2. How are you currently taking this medication (dosage and times per day)? Not listed on chart but pt said she needs this medication with [facility]  3. Are you having a reaction (difficulty breathing--STAT)? N/A  4. What is your medication issue? Medication was forgotten to be called in per pt.

## 2018-06-08 NOTE — Telephone Encounter (Signed)
She can go back on triamtere HCTZ if she would like

## 2018-06-10 ENCOUNTER — Other Ambulatory Visit
Admission: RE | Admit: 2018-06-10 | Discharge: 2018-06-10 | Disposition: A | Discharge status: Home / Self Care | Payer: Medicare HMO | Source: Ambulatory Visit | Attending: Family Medicine | Admitting: Family Medicine

## 2018-06-10 DIAGNOSIS — M7989 Other specified soft tissue disorders: Secondary | ICD-10-CM | POA: Insufficient documentation

## 2018-06-10 DIAGNOSIS — M79606 Pain in leg, unspecified: Secondary | ICD-10-CM | POA: Insufficient documentation

## 2018-06-10 LAB — BRAIN NATRIURETIC PEPTIDE: B Natriuretic Peptide: 41 pg/mL (ref 0.0–100.0)

## 2018-06-10 NOTE — Telephone Encounter (Signed)
Please specify what dosage?  Last I can see she was on 37.5/25mg  back at office visit from 11/10/11.

## 2018-06-11 NOTE — Telephone Encounter (Signed)
Yes, that should be fine BP was elevated Would watch her salt

## 2018-06-12 MED ORDER — TRIAMTERENE-HCTZ 37.5-25 MG PO TABS: 1.0000 | ORAL_TABLET | Freq: Every day | ORAL | 3 refills | Status: DC

## 2018-06-12 NOTE — Telephone Encounter (Signed)
Patient verbalized undrestanding that ok to start back the triamterene/HCTZ once a day and will stop the single HCTZ. Rx sent to pharmacy. Patient also aware to limit salt.

## 2018-06-13 ENCOUNTER — Ambulatory Visit: Payer: Medicare HMO | Admitting: Cardiovascular Disease

## 2018-07-04 ENCOUNTER — Encounter: Payer: Medicare HMO | Attending: Family Medicine | Admitting: *Deleted

## 2018-07-04 ENCOUNTER — Encounter: Payer: Self-pay | Admitting: *Deleted

## 2018-07-04 VITALS — BP 150/74 | Ht 63.0 in | Wt 186.8 lb

## 2018-07-04 DIAGNOSIS — Z713 Dietary counseling and surveillance: Secondary | ICD-10-CM | POA: Insufficient documentation

## 2018-07-04 DIAGNOSIS — Z6833 Body mass index (BMI) 33.0-33.9, adult: Secondary | ICD-10-CM | POA: Diagnosis not present

## 2018-07-04 DIAGNOSIS — E119 Type 2 diabetes mellitus without complications: Secondary | ICD-10-CM | POA: Diagnosis not present

## 2018-07-04 NOTE — Progress Notes (Signed)
Diabetes Self-Management Education  Visit Type: First/Initial  Appt. Start Time: 1430 Appt. End Time: 9417  07/04/2018  Ms. Destiny Ramos, identified by name and date of birth, is a 77 y.o. female with a diagnosis of Diabetes: Type 2.   ASSESSMENT  Blood pressure (!) 150/74, height 5\' 3"  (1.6 m), weight 186 lb 12.8 oz (84.7 kg). Body mass index is 33.09 kg/m.  Diabetes Self-Management Education - 07/04/18 1635      Visit Information   Visit Type  First/Initial      Initial Visit   Diabetes Type  Type 2    Are you currently following a meal plan?  No    Are you taking your medications as prescribed?  Yes    Date Diagnosed  August 2019      Health Coping   How would you rate your overall health?  Good      Psychosocial Assessment   Patient Belief/Attitude about Diabetes  Motivated to manage diabetes    Self-care barriers  None    Self-management support  Doctor's office;Family;Friends    Patient Concerns  Nutrition/Meal planning;Glycemic Control;Monitoring;Weight Control;Healthy Lifestyle    Special Needs  None    Preferred Learning Style  Visual;Auditory    Learning Readiness  Ready    How often do you need to have someone help you when you read instructions, pamphlets, or other written materials from your doctor or pharmacy?  1 - Never    What is the last grade level you completed in school?  12th      Pre-Education Assessment   Patient understands the diabetes disease and treatment process.  Needs Instruction    Patient understands incorporating nutritional management into lifestyle.  Needs Instruction    Patient undertands incorporating physical activity into lifestyle.  Needs Instruction    Patient understands using medications safely.  Needs Instruction    Patient understands monitoring blood glucose, interpreting and using results  Needs Review    Patient understands prevention, detection, and treatment of acute complications.  Needs Instruction    Patient  understands prevention, detection, and treatment of chronic complications.  Needs Instruction    Patient understands how to develop strategies to address psychosocial issues.  Needs Instruction    Patient understands how to develop strategies to promote health/change behavior.  Needs Instruction      Complications   Last HgB A1C per patient/outside source  7.4 %   06/19/18   How often do you check your blood sugar?  1-2 times/day    Postprandial Blood glucose range (mg/dL)  70-129   Pt reports pp's lunch now in 102 mg/dL range.    Have you had a dilated eye exam in the past 12 months?  Yes    Have you had a dental exam in the past 12 months?  No    Are you checking your feet?  Yes    How many days per week are you checking your feet?  6      Dietary Intake   Breakfast  toast with butter and fruit; oat cereal with milk and fruit; sometimes egg and bacon    Lunch  BBQ, fried salmon, eggplant parmesean, soup and 1/2 sandwich, vegetables, slaw, onion rings    Snack (afternoon)  sugar free jello, popcorn    Dinner  grilled chicken, pork, beef, fish, [place] with cabbage, mushrooms, peas, beans, green beans, onions, other non-starchy veggies    Beverage(s)  water, unsweetened tea, coffee  Exercise   Exercise Type  Light (walking / raking leaves)    How many days per week to you exercise?  6    How many minutes per day do you exercise?  30    Total minutes per week of exercise  180      Patient Education   Previous Diabetes Education  No    Disease state   Definition of diabetes, type 1 and 2, and the diagnosis of diabetes    Nutrition management   Role of diet in the treatment of diabetes and the relationship between the three main macronutrients and blood glucose level;Reviewed blood glucose goals for pre and post meals and how to evaluate the patients' food intake on their blood glucose level.;Food label reading, portion sizes and measuring food.    Physical activity and exercise   Role  of exercise on diabetes management, blood pressure control and cardiac health.    Medications  Reviewed patients medication for diabetes, action, purpose, timing of dose and side effects.    Monitoring  Purpose and frequency of SMBG.;Taught/discussed recording of test results and interpretation of SMBG.;Identified appropriate SMBG and/or A1C goals.    Acute complications  Taught treatment of hypoglycemia - the 15 rule.   Pt had questions about hypoglycemia; she reports having some sweating episodes which may be related to BP   Chronic complications  Relationship between chronic complications and blood glucose control;Retinopathy and reason for yearly dilated eye exams    Psychosocial adjustment  Identified and addressed patients feelings and concerns about diabetes      Individualized Goals (developed by patient)   Reducing Risk Improve blood sugars Prevent diabetes complications Lose weight Lead a healthier lifestyle Become more fit     Outcomes   Expected Outcomes  Demonstrated interest in learning. Expect positive outcomes    Future DMSE  2 wks       Individualized Plan for Diabetes Self-Management Training:   Learning Objective:  Patient will have a greater understanding of diabetes self-management. Patient education plan is to attend individual and/or group sessions per assessed needs and concerns.   Plan:   Patient Instructions  Check blood sugars 1 x day before breakfast or 2 hrs after one meal every day Bring blood sugar records to the next class Exercise: Continue walking  for  30  minutes  6 days a week Eat 3 meals day, 1-2 snacks a day Space meals 4-6 hours apart Include protein with breakfast meals Make an eye doctor appointment  Expected Outcomes:  Demonstrated interest in learning. Expect positive outcomes  Education material provided:  General Meal Planning Guidelines Simple Meal Plan Symptoms, causes and treatments of Hypoglycemia  If problems or questions,  patient to contact team via:  Destiny Ramos, [place], [place], CDE [phone]  Future DSME appointment: 2 wks  July 18, 2018 for Diabetes Class 1

## 2018-07-04 NOTE — Patient Instructions (Signed)
Check blood sugars 1 x day before breakfast or 2 hrs after one meal every day Bring blood sugar records to the next class  Exercise: Continue walking  for  30  minutes  6 days a week  Eat 3 meals day, 1-2 snacks a day Space meals 4-6 hours apart Include protein with breakfast meals  Make an eye doctor appointment  Return for classes on:

## 2018-07-18 ENCOUNTER — Encounter: Payer: Medicare HMO | Admitting: Dietician

## 2018-07-18 ENCOUNTER — Encounter: Payer: Self-pay | Admitting: Dietician

## 2018-07-18 VITALS — Wt 184.3 lb

## 2018-07-18 DIAGNOSIS — Z713 Dietary counseling and surveillance: Secondary | ICD-10-CM | POA: Diagnosis not present

## 2018-07-18 DIAGNOSIS — E119 Type 2 diabetes mellitus without complications: Secondary | ICD-10-CM

## 2018-07-18 NOTE — Progress Notes (Signed)

## 2018-07-25 ENCOUNTER — Encounter: Payer: Self-pay | Admitting: *Deleted

## 2018-07-25 ENCOUNTER — Encounter: Payer: Medicare HMO | Attending: Family Medicine | Admitting: *Deleted

## 2018-07-25 VITALS — Wt 184.6 lb

## 2018-07-25 DIAGNOSIS — E119 Type 2 diabetes mellitus without complications: Secondary | ICD-10-CM

## 2018-07-25 DIAGNOSIS — Z6833 Body mass index (BMI) 33.0-33.9, adult: Secondary | ICD-10-CM | POA: Insufficient documentation

## 2018-07-25 DIAGNOSIS — Z713 Dietary counseling and surveillance: Secondary | ICD-10-CM | POA: Insufficient documentation

## 2018-07-25 NOTE — Progress Notes (Signed)

## 2018-08-01 ENCOUNTER — Encounter: Payer: Self-pay | Admitting: *Deleted

## 2018-08-01 ENCOUNTER — Encounter: Payer: Self-pay | Admitting: Dietician

## 2018-08-01 ENCOUNTER — Encounter: Payer: Medicare HMO | Admitting: Dietician

## 2018-08-01 VITALS — BP 130/80 | Ht 63.0 in | Wt 185.4 lb

## 2018-08-01 DIAGNOSIS — E119 Type 2 diabetes mellitus without complications: Secondary | ICD-10-CM

## 2018-08-01 DIAGNOSIS — Z713 Dietary counseling and surveillance: Secondary | ICD-10-CM | POA: Diagnosis not present

## 2018-08-01 NOTE — Progress Notes (Signed)

## 2018-11-11 DIAGNOSIS — C44629 Squamous cell carcinoma of skin of left upper limb, including shoulder: Secondary | ICD-10-CM | POA: Diagnosis not present

## 2018-11-11 DIAGNOSIS — D485 Neoplasm of uncertain behavior of skin: Secondary | ICD-10-CM | POA: Diagnosis not present

## 2018-11-11 DIAGNOSIS — L578 Other skin changes due to chronic exposure to nonionizing radiation: Secondary | ICD-10-CM | POA: Diagnosis not present

## 2018-11-11 DIAGNOSIS — E119 Type 2 diabetes mellitus without complications: Secondary | ICD-10-CM | POA: Diagnosis not present

## 2018-11-11 DIAGNOSIS — Z808 Family history of malignant neoplasm of other organs or systems: Secondary | ICD-10-CM | POA: Diagnosis not present

## 2018-11-12 DIAGNOSIS — E782 Mixed hyperlipidemia: Secondary | ICD-10-CM | POA: Diagnosis not present

## 2018-11-12 DIAGNOSIS — E119 Type 2 diabetes mellitus without complications: Secondary | ICD-10-CM | POA: Diagnosis not present

## 2018-11-12 DIAGNOSIS — I10 Essential (primary) hypertension: Secondary | ICD-10-CM | POA: Diagnosis not present

## 2018-11-12 DIAGNOSIS — Z0000 Encounter for general adult medical examination without abnormal findings: Secondary | ICD-10-CM | POA: Diagnosis not present

## 2018-11-19 DIAGNOSIS — E782 Mixed hyperlipidemia: Secondary | ICD-10-CM | POA: Diagnosis not present

## 2018-11-19 DIAGNOSIS — I10 Essential (primary) hypertension: Secondary | ICD-10-CM | POA: Diagnosis not present

## 2018-11-19 DIAGNOSIS — E119 Type 2 diabetes mellitus without complications: Secondary | ICD-10-CM | POA: Diagnosis not present

## 2019-02-14 ENCOUNTER — Other Ambulatory Visit: Payer: Self-pay

## 2019-02-14 NOTE — Patient Outreach (Signed)
[facility]) Care Management  02/14/2019  MARLICIA SROKA 13-May-1941 [id number]  Nurse Call Line Referral Date: 02/14/19 Reason for Referral:  Ear fullness and congestion Nurse call line recommendation:  Contact doctor office  ATTEMPT #1  Telephone call to patient regarding nurse call line referral. Unable to reach patient. HIPAA compliant voice message left with call back phone number.   PLAN: RNCM will attempt 2nd telephone call to patient within 4 business days. RNCM will send outreach letter.   Quinn Plowman RN,BSN, [place] Telephonic  [phone]

## 2019-02-17 ENCOUNTER — Other Ambulatory Visit: Payer: Self-pay

## 2019-02-17 DIAGNOSIS — H6123 Impacted cerumen, bilateral: Secondary | ICD-10-CM | POA: Diagnosis not present

## 2019-02-17 NOTE — Patient Outreach (Signed)
[place] [facility]) Care Management  02/17/2019  Destiny Ramos 04/13/1941 [id number]   Nurse Call Line Referral Date: 02/14/19 Reason for Referral:  Ear fullness and congestion Nurse call line recommendation:  Contact doctor office  ATTEMPT #2  Telephone call to patient regarding Nurse call line referral. Unable to reach.  Attempted call to listed home number.  No answer, phone only rang. Attempted call to listed mobile number. HIPAA compliant voice message left with call back phone number.   RNCM will attempt 3rd telephone call to patient within 4 business days.    Quinn Plowman RN,BSN,CCM [facility] Telephonic  [phone]

## 2019-02-18 ENCOUNTER — Other Ambulatory Visit: Payer: Self-pay

## 2019-02-18 NOTE — Patient Outreach (Signed)
[place]) Care Management  02/18/2019  Destiny Ramos 1941/02/02 [id number]  Nurse Call Line Referral Date: 02/14/19 Reason for Referral:  Ear fullness and congestion Nurse call line recommendation:  Contact doctor office  Telephone call to patient regarding nurse call line. HIPAA verified.  RNCM introduced herself and explained reason for call.  Patient states she went to the walk in clinic on yesterday to be seen. She states the doctor look at her ears and attempted some irrigation which became uncomfortable for her. Patient states the doctor referred her to an ENT doctor.  She states she has an appointment scheduled for 02/17/19.  Patient states she was seen several weeks ago by her primary MD and was treated for a sinus infection. Patient states she completed the antibiotics but it did not help.  Patient verbalized appreciation for the follow up call.   RNCM discussed COVID 19 precautions and symptoms. Advised patient to contact her doctor for minor symptoms. If symptoms more severe call 911.  Patient verbalized understanding.   PLAN;  RNCM will close due to patient being assessed and having no further needs.   Quinn Plowman RN,BSN,CCM [facility] Telephonic  [phone]

## 2019-02-20 ENCOUNTER — Ambulatory Visit: Payer: Self-pay

## 2019-02-24 DIAGNOSIS — H6120 Impacted cerumen, unspecified ear: Secondary | ICD-10-CM | POA: Diagnosis not present

## 2019-02-24 DIAGNOSIS — H902 Conductive hearing loss, unspecified: Secondary | ICD-10-CM | POA: Diagnosis not present

## 2019-03-07 ENCOUNTER — Other Ambulatory Visit: Payer: Self-pay | Admitting: Cardiovascular Disease

## 2019-04-03 ENCOUNTER — Telehealth: Payer: Self-pay | Admitting: Cardiovascular Disease

## 2019-04-03 ENCOUNTER — Other Ambulatory Visit: Payer: Self-pay

## 2019-04-03 MED ORDER — METOPROLOL SUCCINATE ER 25 MG PO TB24: 25.0000 mg | ORAL_TABLET | Freq: Two times a day (BID) | ORAL | 3 refills | Status: DC

## 2019-04-03 NOTE — Telephone Encounter (Signed)
°*  STAT* If patient is at the pharmacy, call can be transferred to refill team.   1. Which medications need to be refilled? (please list name of each medication and dose if known) METOPROLOL 25mg  BID  2. Which pharmacy/location (including street and city if local pharmacy) is medication to be sent to? [facility]  3. Do they need a 30 day or 90 day supply? 90 day

## 2019-04-03 NOTE — Telephone Encounter (Signed)
metoprolol succinate (TOPROL-XL) 25 MG 24 hr tablet 180 tablet 3 04/03/2019    Sig - Route: Take 1 tablet (25 mg total) by mouth 2 (two) times daily. - Oral   Sent to pharmacy as: metoprolol succinate (TOPROL-XL) 25 MG 24 hr tablet   E-Prescribing Status: Receipt confirmed by pharmacy (04/03/2019 3:46 PM EDT)   [place], [place]

## 2019-04-29 ENCOUNTER — Other Ambulatory Visit: Payer: Self-pay | Admitting: Family Medicine

## 2019-04-29 DIAGNOSIS — Z1231 Encounter for screening mammogram for malignant neoplasm of breast: Secondary | ICD-10-CM

## 2019-05-07 ENCOUNTER — Telehealth: Payer: Self-pay | Admitting: Cardiovascular Disease

## 2019-05-07 NOTE — Telephone Encounter (Signed)
Line busy and no answering machine.

## 2019-05-07 NOTE — Telephone Encounter (Signed)
Patient calling Would like to discuss with nurse the difference between a fib and SVT  Please call

## 2019-05-08 NOTE — Telephone Encounter (Signed)
I called and spoke with the patient. Answered her questions about a-fib vs SVT. She states that a-fib has never been documented, but she will have intermittent fast racing of her heart that has not been caught on a monitor.   I have advised that she try to obtain the [place]) for her phone. She states she will check in to this.

## 2019-06-09 ENCOUNTER — Other Ambulatory Visit: Payer: Self-pay | Admitting: Cardiovascular Disease

## 2019-08-19 ENCOUNTER — Other Ambulatory Visit: Payer: Self-pay

## 2019-08-19 MED ORDER — TRIAMTERENE-HCTZ 37.5-25 MG PO TABS: 1.0000 | ORAL_TABLET | Freq: Every day | ORAL | 0 refills | Status: DC

## 2019-08-19 NOTE — Telephone Encounter (Signed)
*  STAT* If patient is at the pharmacy, call can be transferred to refill team.   1. Which medications need to be refilled? (please list name of each medication and dose if known) HCTZ  2. Which pharmacy/location (including street and city if local pharmacy) is medication to be sent to? [facility]  3. Do they need a 30 day or 90 day supply? [place]

## 2019-09-03 ENCOUNTER — Ambulatory Visit: Payer: PPO | Admitting: Cardiovascular Disease

## 2019-09-16 DIAGNOSIS — E782 Mixed hyperlipidemia: Secondary | ICD-10-CM | POA: Diagnosis not present

## 2019-09-16 DIAGNOSIS — K219 Gastro-esophageal reflux disease without esophagitis: Secondary | ICD-10-CM | POA: Diagnosis not present

## 2019-09-16 DIAGNOSIS — E119 Type 2 diabetes mellitus without complications: Secondary | ICD-10-CM | POA: Diagnosis not present

## 2019-09-16 DIAGNOSIS — I10 Essential (primary) hypertension: Secondary | ICD-10-CM | POA: Diagnosis not present

## 2019-09-23 ENCOUNTER — Ambulatory Visit: Payer: PPO | Admitting: Cardiovascular Disease

## 2019-09-30 DIAGNOSIS — E119 Type 2 diabetes mellitus without complications: Secondary | ICD-10-CM | POA: Diagnosis not present

## 2019-09-30 DIAGNOSIS — E782 Mixed hyperlipidemia: Secondary | ICD-10-CM | POA: Diagnosis not present

## 2019-10-21 DIAGNOSIS — R600 Localized edema: Secondary | ICD-10-CM | POA: Diagnosis not present

## 2019-11-11 ENCOUNTER — Telehealth: Payer: Self-pay | Admitting: Cardiovascular Disease

## 2019-11-11 NOTE — Telephone Encounter (Signed)
*  STAT* If patient is at the pharmacy, call can be transferred to refill team.   1. Which medications need to be refilled? (please list name of each medication and dose if known) Propanolol 20 mg po TID prn   2. Which pharmacy/location (including street and city if local pharmacy) is medication to be sent to? [facility]  3. Do they need a 30 day or 90 day supply? [place]

## 2019-11-12 NOTE — Telephone Encounter (Signed)
Please schedule overdue 12 mo F/U with Dr. Rockey Situ. Thank you!

## 2019-11-12 NOTE — Telephone Encounter (Signed)
Called patient to schedule overdue fo/u - no answer, no VM

## 2019-11-17 ENCOUNTER — Ambulatory Visit
Admission: RE | Admit: 2019-11-17 | Discharge: 2019-11-17 | Disposition: A | Discharge status: Home / Self Care | Payer: PPO | Source: Ambulatory Visit | Attending: Family Medicine | Admitting: Family Medicine

## 2019-11-17 ENCOUNTER — Other Ambulatory Visit: Payer: Self-pay | Admitting: Family Medicine

## 2019-11-17 ENCOUNTER — Other Ambulatory Visit: Payer: Self-pay

## 2019-11-17 DIAGNOSIS — R600 Localized edema: Secondary | ICD-10-CM | POA: Diagnosis not present

## 2019-11-17 DIAGNOSIS — I10 Essential (primary) hypertension: Secondary | ICD-10-CM | POA: Diagnosis not present

## 2019-11-17 DIAGNOSIS — E782 Mixed hyperlipidemia: Secondary | ICD-10-CM | POA: Diagnosis not present

## 2019-11-17 DIAGNOSIS — E119 Type 2 diabetes mellitus without complications: Secondary | ICD-10-CM | POA: Diagnosis not present

## 2019-11-20 NOTE — Telephone Encounter (Signed)
.  noa

## 2019-11-24 NOTE — Progress Notes (Deleted)
Cardiology Office Note    Date:  11/24/2019   ID:  ONYINYECHI CLAPSADDLE, DOB 1940-12-16, MRN [id number]  PCP:  Donnamarie Rossetti, PA-C  Cardiologist:  Ida Rogue, MD  Electrophysiologist:  None   Chief Complaint: Follow-up  History of Present Illness:   Destiny Ramos is a 79 y.o. female with history of paroxysmal SVT, DM2, HTN, HLD, fibromyalgia, chronic lower extremity venous insufficiency near syncope, and hyperglycemia who presents for follow-up of SVT.  Outpatient cardiac monitoring in 2011 showed sinus rhythm and no significant arrhythmia.  Echo from 03/2011 showed an EF of 55%, mild LVH, mild MR and TR.  Note indicates patient was documented to have SVT with EMS in 09/2016 with rates in the 220s bpm with spontaneous conversion.  She was most recently seen in our office in 05/2018 noting rare short episodes of palpitations.  BP was suboptimally controlled at 99991111 systolic.  Weight 190 pounds, which was up 8 pounds from her visit in 05/2017.  She did not have a formal exercise regimen.  In this setting, HCTZ was restarted given worsening lower extremity swelling and elevated BP.  More recently, patient was seen by PCP in 09/2018 with bilateral lower extremity swelling suspected to be related to lymphedema and treated with compression stockings and low-sodium diet with some improvement in symptoms.  However, she was seen in late 10/2019 with worsening swelling with venous ultrasound being negative for DVT bilaterally.  Labs were stable as outlined below.  She was advised to take Lasix 20 mg daily for 4 days.  ***   Labs independently reviewed: 10/2019 - potassium 3.7, BUN 17, serum creatinine 0.9, albumin 4.1, AST/ALT normal 08/2019 - A1c 7.9, Hgb 13.4, PLT 254, TC 245, TG 145, HDL 50, LDL 165, TSH normal  Past Medical History:  Diagnosis Date  . Chronic venous insufficiency    with history of phlebitis  . Diabetes mellitus without complication ([place])   . Fibromyalgia   .  Hyperlipidemia   . Hypertension   . Palpitations   . Tachycardia     Past Surgical History:  Procedure Laterality Date  . ABDOMINAL HYSTERECTOMY    . APPENDECTOMY    . CATARACT EXTRACTION, BILATERAL    . TONSILLECTOMY      Current Medications: No outpatient medications have been marked as taking for the 11/26/19 encounter (Appointment) with Rise Mu, PA-C.    Allergies:   Zithromax [azithromycin]   Social History   Socioeconomic History  . Marital status: Married    Spouse name: Not on file  . Number of children: Not on file  . Years of education: Not on file  . Highest education level: Not on file  Occupational History  . Not on file  Tobacco Use  . Smoking status: Never Smoker  . Smokeless tobacco: Never Used  Substance and Sexual Activity  . Alcohol use: Yes    Comment: occasionally - not even weekly  . Drug use: No  . Sexual activity: Not on file  Other Topics Concern  . Not on file  Social History Narrative  . Not on file   Social Determinants of Health   Financial Resource Strain:   . Difficulty of Paying Living Expenses: Not on file  Food Insecurity:   . Worried About Charity fundraiser in the Last Year: Not on file  . Ran Out of Food in the Last Year: Not on file  Transportation Needs:   . Lack of Transportation (Medical): Not  on file  . Lack of Transportation (Non-Medical): Not on file  Physical Activity:   . Days of Exercise per Week: Not on file  . Minutes of Exercise per Session: Not on file  Stress:   . Feeling of Stress : Not on file  Social Connections:   . Frequency of Communication with Friends and Family: Not on file  . Frequency of Social Gatherings with Friends and Family: Not on file  . Attends Religious Services: Not on file  . Active Member of Clubs or Organizations: Not on file  . Attends Archivist Meetings: Not on file  . Marital Status: Not on file     Family History:  The patient's family history includes  Cardiomyopathy in her father; Diabetes in her maternal aunt, maternal uncle, and maternal uncle.  ROS:   ROS   EKGs/Labs/Other Studies Reviewed:    Studies reviewed were summarized above. The additional studies were reviewed today:  2D echo 03/2011: EF 55%, mild mitral regurgitation, trace aortic insufficiency, mild tricuspid regurgitation, no pericardial effusion, normal RV systolic function and cavity size _________  Carotid artery ultrasound 03/2011: No evidence of hemodynamically significant stenosis   EKG:  EKG is ordered today.  The EKG ordered today demonstrates ***  Recent Labs: No results found for requested labs within last 8760 hours.  Recent Lipid Panel No results found for: CHOL, TRIG, HDL, CHOLHDL, VLDL, LDLCALC, LDLDIRECT  PHYSICAL EXAM:    VS:  There were no vitals taken for this visit.  BMI: There is no height or weight on file to calculate BMI.  Physical Exam  Wt Readings from Last 3 Encounters:  08/01/18 185 lb 6.4 oz (84.1 kg)  07/25/18 184 lb 9.6 oz (83.7 kg)  07/18/18 184 lb 4.8 oz (83.6 kg)     ASSESSMENT & PLAN:   1. ***  Disposition: F/u with Dr. Rockey Situ or an APP in ***.   Medication Adjustments/Labs and Tests Ordered: Current medicines are reviewed at length with the patient today.  Concerns regarding medicines are outlined above. Medication changes, Labs and Tests ordered today are summarized above and listed in the Patient Instructions accessible in Encounters.   Signed, Christell Faith, PA-C 11/24/2019 8:11 AM     [address], [place] [address] [phone]

## 2019-11-26 ENCOUNTER — Ambulatory Visit: Payer: PPO | Admitting: Physician Assistant

## 2019-12-06 ENCOUNTER — Other Ambulatory Visit: Payer: Self-pay | Admitting: Cardiovascular Disease

## 2019-12-15 ENCOUNTER — Encounter: Payer: Self-pay | Admitting: Cardiovascular Disease

## 2019-12-15 NOTE — Telephone Encounter (Signed)
Attempted to schedule patient did not respond on phone will mail letter.

## 2020-01-12 DIAGNOSIS — E119 Type 2 diabetes mellitus without complications: Secondary | ICD-10-CM | POA: Diagnosis not present

## 2020-01-12 DIAGNOSIS — E782 Mixed hyperlipidemia: Secondary | ICD-10-CM | POA: Diagnosis not present

## 2020-01-12 DIAGNOSIS — I10 Essential (primary) hypertension: Secondary | ICD-10-CM | POA: Diagnosis not present

## 2020-01-19 DIAGNOSIS — R600 Localized edema: Secondary | ICD-10-CM | POA: Diagnosis not present

## 2020-01-19 DIAGNOSIS — E782 Mixed hyperlipidemia: Secondary | ICD-10-CM | POA: Diagnosis not present

## 2020-01-19 DIAGNOSIS — Z0000 Encounter for general adult medical examination without abnormal findings: Secondary | ICD-10-CM | POA: Diagnosis not present

## 2020-01-19 DIAGNOSIS — I10 Essential (primary) hypertension: Secondary | ICD-10-CM | POA: Diagnosis not present

## 2020-01-19 DIAGNOSIS — K219 Gastro-esophageal reflux disease without esophagitis: Secondary | ICD-10-CM | POA: Diagnosis not present

## 2020-01-19 DIAGNOSIS — E1165 Type 2 diabetes mellitus with hyperglycemia: Secondary | ICD-10-CM | POA: Diagnosis not present

## 2020-01-19 DIAGNOSIS — E118 Type 2 diabetes mellitus with unspecified complications: Secondary | ICD-10-CM | POA: Diagnosis not present

## 2020-01-23 ENCOUNTER — Encounter: Payer: Self-pay | Admitting: Emergency Medicine

## 2020-01-23 ENCOUNTER — Emergency Department: Payer: PPO

## 2020-01-23 ENCOUNTER — Emergency Department
Admission: EM | Admit: 2020-01-23 | Discharge: 2020-01-23 | Disposition: A | Discharge status: Home / Self Care | Payer: PPO | Attending: Emergency Medicine | Admitting: Emergency Medicine

## 2020-01-23 ENCOUNTER — Other Ambulatory Visit: Payer: Self-pay

## 2020-01-23 DIAGNOSIS — I10 Essential (primary) hypertension: Secondary | ICD-10-CM | POA: Insufficient documentation

## 2020-01-23 DIAGNOSIS — I872 Venous insufficiency (chronic) (peripheral): Secondary | ICD-10-CM | POA: Diagnosis not present

## 2020-01-23 DIAGNOSIS — X509XXA Other and unspecified overexertion or strenuous movements or postures, initial encounter: Secondary | ICD-10-CM | POA: Insufficient documentation

## 2020-01-23 DIAGNOSIS — Y999 Unspecified external cause status: Secondary | ICD-10-CM | POA: Diagnosis not present

## 2020-01-23 DIAGNOSIS — S86911A Strain of unspecified muscle(s) and tendon(s) at lower leg level, right leg, initial encounter: Secondary | ICD-10-CM | POA: Insufficient documentation

## 2020-01-23 DIAGNOSIS — Z7982 Long term (current) use of aspirin: Secondary | ICD-10-CM | POA: Insufficient documentation

## 2020-01-23 DIAGNOSIS — M7989 Other specified soft tissue disorders: Secondary | ICD-10-CM | POA: Diagnosis not present

## 2020-01-23 DIAGNOSIS — Y92000 Kitchen of unspecified non-institutional (private) residence as  the place of occurrence of the external cause: Secondary | ICD-10-CM | POA: Insufficient documentation

## 2020-01-23 DIAGNOSIS — Z7984 Long term (current) use of oral hypoglycemic drugs: Secondary | ICD-10-CM | POA: Insufficient documentation

## 2020-01-23 DIAGNOSIS — Z79899 Other long term (current) drug therapy: Secondary | ICD-10-CM | POA: Diagnosis not present

## 2020-01-23 DIAGNOSIS — Y9389 Activity, other specified: Secondary | ICD-10-CM | POA: Diagnosis not present

## 2020-01-23 DIAGNOSIS — E119 Type 2 diabetes mellitus without complications: Secondary | ICD-10-CM | POA: Insufficient documentation

## 2020-01-23 DIAGNOSIS — M25561 Pain in right knee: Secondary | ICD-10-CM | POA: Diagnosis not present

## 2020-01-23 DIAGNOSIS — T148XXA Other injury of unspecified body region, initial encounter: Secondary | ICD-10-CM

## 2020-01-23 DIAGNOSIS — M79661 Pain in right lower leg: Secondary | ICD-10-CM | POA: Diagnosis not present

## 2020-01-23 DIAGNOSIS — M79604 Pain in right leg: Secondary | ICD-10-CM | POA: Diagnosis present

## 2020-01-23 MED ORDER — ACETAMINOPHEN 500 MG PO TABS
1000.0000 mg | ORAL_TABLET | Freq: Once | ORAL | Status: AC
  Administered 2020-01-23: 1000 mg via ORAL
  Filled 2020-01-23: qty 2

## 2020-01-23 NOTE — ED Triage Notes (Signed)
Pt here from urgent care for r/o DVT.  C/o right calf and knee pain and swelling starting yesterday. Pt denies hx of DVT but her mother has many.

## 2020-01-23 NOTE — ED Provider Notes (Signed)
[facility] Emergency Department Provider Note  ____________________________________________   First MD Initiated Contact with Patient 01/23/20 1222     (approximate)  I have reviewed the triage vital signs and the nursing notes.   HISTORY  Chief Complaint Leg Pain    HPI Destiny Ramos is a 79 y.o. female with diabetes, hypertension, hyperlipidemia with chronic venous sufficiency who comes in from urgent care to rule out DVT.  Patient has right calf and knee pain and swelling starting yesterday.  Patient stated that she was working in the kitchen and she thinks she twisted her leg.  She did not fall and hit the ground.  She stated that she was having pain with moving her right knee such as bending, walking on her leg, moderate, better with some Tylenol.  It was a dull ache.  Patient states that the pain is much better today but she just want to make sure that everything looked okay.  Denies any changes in sensation of her leg, changes in skin color, coldness to her foot or any other concerns.  Patient was sent over from urgent care for evaluation of DVT            Past Medical History:  Diagnosis Date  . Chronic venous insufficiency    with history of phlebitis  . Diabetes mellitus without complication ([place])   . Fibromyalgia   . Hyperlipidemia   . Hypertension   . Palpitations   . Tachycardia     Patient Active Problem List   Diagnosis Date Noted  . SVT (supraventricular tachycardia) ([place]) 06/11/2017  . Morbid obesity ([place]) 08/01/2016  . Lightheaded 11/19/2012  . Mixed hyperlipidemia 05/08/2011  . Near syncope 04/06/2011  . Tachycardia 04/06/2011  . Essential hypertension 04/06/2011    Past Surgical History:  Procedure Laterality Date  . ABDOMINAL HYSTERECTOMY    . APPENDECTOMY    . CATARACT EXTRACTION, BILATERAL    . TONSILLECTOMY      Prior to Admission medications   Medication Sig Start Date End Date Taking? Authorizing  Provider  aspirin 81 MG EC tablet Take 81 mg by mouth daily.      [provider]  metFORMIN (GLUCOPHAGE) 500 MG tablet Take 500 mg by mouth 2 (two) times daily. 06/20/18 06/20/19  [provider]  metoprolol succinate (TOPROL-XL) 25 MG 24 hr tablet Take 1 tablet (25 mg total) by mouth 2 (two) times daily. 04/03/19   Minna Merritts, MD  potassium chloride (K-DUR) 10 MEQ tablet Take 1 tablet (10 mEq total) by mouth daily. 06/06/18   Minna Merritts, MD  propranolol (INDERAL) 20 MG tablet TAKE ONE TABLET THREE TIMES A DAY AS NEEDED 10/13/16   Minna Merritts, MD  rosuvastatin (CRESTOR) 5 MG tablet Take 1 tablet (5 mg total) by mouth daily. 06/06/18   Minna Merritts, MD  triamterene-hydrochlorothiazide (MAXZIDE-25) 37.5-25 MG tablet TAKE 1 TABLET BY MOUTH DAILY 12/08/19   Minna Merritts, MD  Vitamin D, Ergocalciferol, (DRISDOL) 50000 units CAPS capsule Take 50,000 Units by mouth once a week.    [provider]    Allergies Zithromax [azithromycin]  Family History  Problem Relation Age of Onset  . Cardiomyopathy Father   . Diabetes Maternal Aunt   . Diabetes Maternal Uncle   . Diabetes Maternal Uncle     Social History Social History   Tobacco Use  . Smoking status: Never Smoker  . Smokeless tobacco: Never Used  Substance Use Topics  .  Alcohol use: Yes    Comment: occasionally - not even weekly  . Drug use: No      Review of Systems Constitutional: No fever/chills Eyes: No visual changes. ENT: No sore throat. Cardiovascular: Denies chest pain. Respiratory: Denies shortness of breath. Gastrointestinal: No abdominal pain.  No nausea, no vomiting.  No diarrhea.  No constipation. Genitourinary: Negative for dysuria. Musculoskeletal: Negative for back pain.  Right knee pain Skin: Negative for rash. Neurological: Negative for headaches, focal weakness or numbness. All other ROS negative ____________________________________________   PHYSICAL  EXAM:  VITAL SIGNS: ED Triage Vitals  Enc Vitals Group     BP 01/23/20 1155 (!) 176/104     Pulse Rate 01/23/20 1155 89     Resp 01/23/20 1155 16     Temp 01/23/20 1155 97.9 F (36.6 C)     Temp Source 01/23/20 1155 Oral     SpO2 01/23/20 1155 96 %     Weight 01/23/20 1150 191 lb (86.6 kg)     Height 01/23/20 1150 5\' 7"  (1.702 m)     Head Circumference --      Peak Flow --      Pain Score 01/23/20 1150 7     Pain Loc --      Pain Edu? --      Excl. in [place]? --     Constitutional: Alert and oriented. Well appearing and in no acute distress. Eyes: Conjunctivae are normal. EOMI. Head: Atraumatic. Nose: No congestion/rhinnorhea. Mouth/Throat: Mucous membranes are moist.   Neck: No stridor. Trachea Midline. FROM Cardiovascular: Normal rate, regular rhythm. Grossly normal heart sounds.  Good peripheral circulation. Respiratory: Normal respiratory effort.  No retractions. Lungs CTAB. Gastrointestinal: Soft and nontender. No distention. No abdominal bruits.  Musculoskeletal: No bruising or swelling noted to the right leg.  Full range of motion of the knee.  No significant tenderness at this time.  Difficult to palpate pulses bilaterally although patient states that this is baseline for her and that her primary care doctor is aware and they are working on discussing further imaging for possible chronic vascular issues.  At this time there is no changes in skin color in the foot is warm.  No erythema of the leg. Neurologic:  Normal speech and language. No gross focal neurologic deficits are appreciated.  Skin:  Skin is warm, dry and intact. No rash noted. Psychiatric: Mood and affect are normal. Speech and behavior are normal. GU: Deferred   ____________________________________________   RADIOLOGY   Official radiology report(s): US Venous Img Lower Unilateral Right  Result Date: 01/23/2020 CLINICAL DATA:  Swelling and pain, right calf and knee x1 day EXAM: RIGHT LOWER EXTREMITY VENOUS  DOPPLER ULTRASOUND TECHNIQUE: Gray-scale sonography with compression, as well as color and duplex ultrasound, were performed to evaluate the deep venous system(s) from the level of the common femoral vein through the popliteal and proximal calf veins. COMPARISON:  11/17/2019 FINDINGS: VENOUS Normal compressibility of the common femoral, superficial femoral, and popliteal veins, as well as the visualized calf veins. Visualized portions of profunda femoral vein and great saphenous vein unremarkable. No filling defects to suggest DVT on grayscale or color Doppler imaging. Doppler waveforms show normal direction of venous flow, normal respiratory phasicity and response to augmentation. Limited views of the contralateral common femoral vein are unremarkable. OTHER None. Limitations: none IMPRESSION: No femoropopliteal DVT nor evidence of DVT within the visualized calf veins. If clinical symptoms are inconsistent or if there are persistent or worsening symptoms, further  imaging (possibly involving the iliac veins) may be warranted. Electronically Signed   By: Lucrezia Europe M.D.   On: 01/23/2020 12:37    ____________________________________________   PROCEDURES  Procedure(s) performed (including Critical Care):  Procedures   ____________________________________________   INITIAL IMPRESSION / ASSESSMENT AND PLAN / ED COURSE  ELSE MCGAUGHEY was evaluated in Emergency Department on 01/23/2020 for the symptoms described in the history of present illness. She was evaluated in the context of the global COVID-19 pandemic, which necessitated consideration that the patient might be at risk for infection with the SARS-CoV-2 virus that causes COVID-19. Institutional protocols and algorithms that pertain to the evaluation of patients at risk for COVID-19 are in a state of rapid change based on information released by regulatory bodies including the CDC and federal and state organizations. These policies and  algorithms were followed during the patient's care in the ED.    Patient comes in with right knee pain after she thinks twisting it yesterday.  No evidence of septic joint on exam.  No significant tenderness and patient did not fall we discussed getting an x-ray but patient is okay with holding off at this time given low suspicion for fracture.  Ultrasound ordered in triage to rule out DVT which was negative and I have low suspicion given patient's exam and her story.  Patient has difficult to feel pulse but that is the same on both feet and there is no skin changes or coldness to suggest acute limb ischemia.  Her primary doctor is already following this for her.  Patient states that her pain is already much improved and she is just relieved that the ultrasound looks normal.  She is okay with going home and taking Tylenol and ibuprofen as needed for discomfort and return to the ER if she develops any new symptoms.  Patient blood pressure was slightly elevated which I have alerted patient to follow-up with her primary care doctor  I discussed the provisional nature of ED diagnosis, the treatment so far, the ongoing plan of care, follow up appointments and return precautions with the patient and any family or support people present. They expressed understanding and agreed with the plan, discharged home.           ____________________________________________   FINAL CLINICAL IMPRESSION(S) / ED DIAGNOSES   Final diagnoses:  Muscle strain  Acute pain of right knee      MEDICATIONS GIVEN DURING THIS VISIT:  Medications  acetaminophen (TYLENOL) tablet 1,000 mg (1,000 mg Oral Given 01/23/20 1340)     ED Discharge Orders    None       Note:  This document was prepared using Dragon voice recognition software and may include unintentional dictation errors.   Vanessa , MD 01/24/20 [phone]

## 2020-01-23 NOTE — Discharge Instructions (Signed)
Your blood pressure was elevated and you should have this rechecked with her primary doctor.  It was difficult to feel your pulses on both sides.  You should continue to have this being followed by your primary doctor but there is no evidence of this acute limb ischemia at this time.  You take Tylenol 1 g every 8 hours to help with the knee pain.  Return to the ER if you develop worsening leg swelling, discoloration of your leg or any other concerns   IMPRESSION: No femoropopliteal DVT nor evidence of DVT within the visualized calf veins.   If clinical symptoms are inconsistent or if there are persistent or worsening symptoms, further imaging (possibly involving the iliac veins) may be warranted.

## 2020-01-23 NOTE — ED Triage Notes (Signed)
FIRST NURSE NOTE- sent from urgent care r/o DVT. Pt has had leg pain and swelling.  NAD

## 2020-01-27 ENCOUNTER — Ambulatory Visit: Payer: PPO | Admitting: Cardiovascular Disease

## 2020-02-10 ENCOUNTER — Ambulatory Visit: Payer: PPO | Admitting: Cardiovascular Disease

## 2020-02-23 DIAGNOSIS — E118 Type 2 diabetes mellitus with unspecified complications: Secondary | ICD-10-CM | POA: Diagnosis not present

## 2020-02-23 DIAGNOSIS — E1165 Type 2 diabetes mellitus with hyperglycemia: Secondary | ICD-10-CM | POA: Diagnosis not present

## 2020-03-16 ENCOUNTER — Ambulatory Visit: Payer: PPO | Admitting: Cardiovascular Disease

## 2020-03-24 IMAGING — US US BREAST*L* LIMITED INC AXILLA
1 series · 5 of 5 positions shown · non-contrast
Comparison: 03/06/2018

CLINICAL DATA: 77-year-old patient recalled from recent screening
mammogram for evaluation of possible left breast mass.

EXAM:
DIGITAL DIAGNOSTIC LEFT MAMMOGRAM WITH CAD AND TOMO
ULTRASOUND LEFT BREAST

[Series 1: us breast*left* limited inc axilla · 0.06mm/px · 5 of 5 slices shown]
[im 1/5]
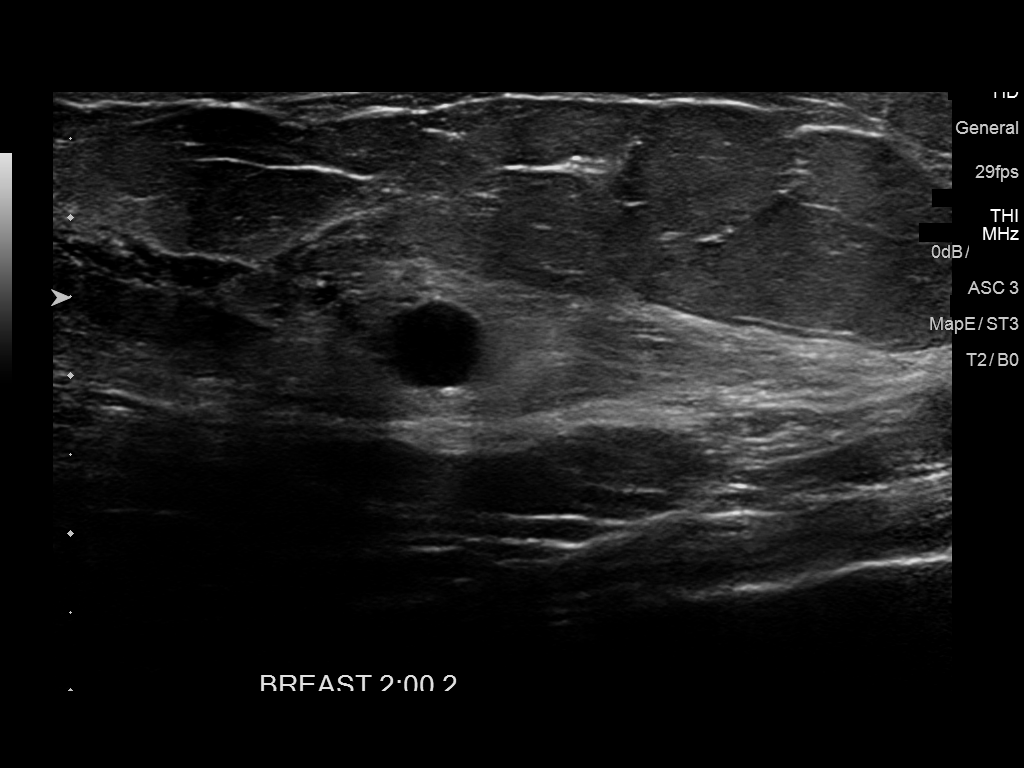
[im 2/5]
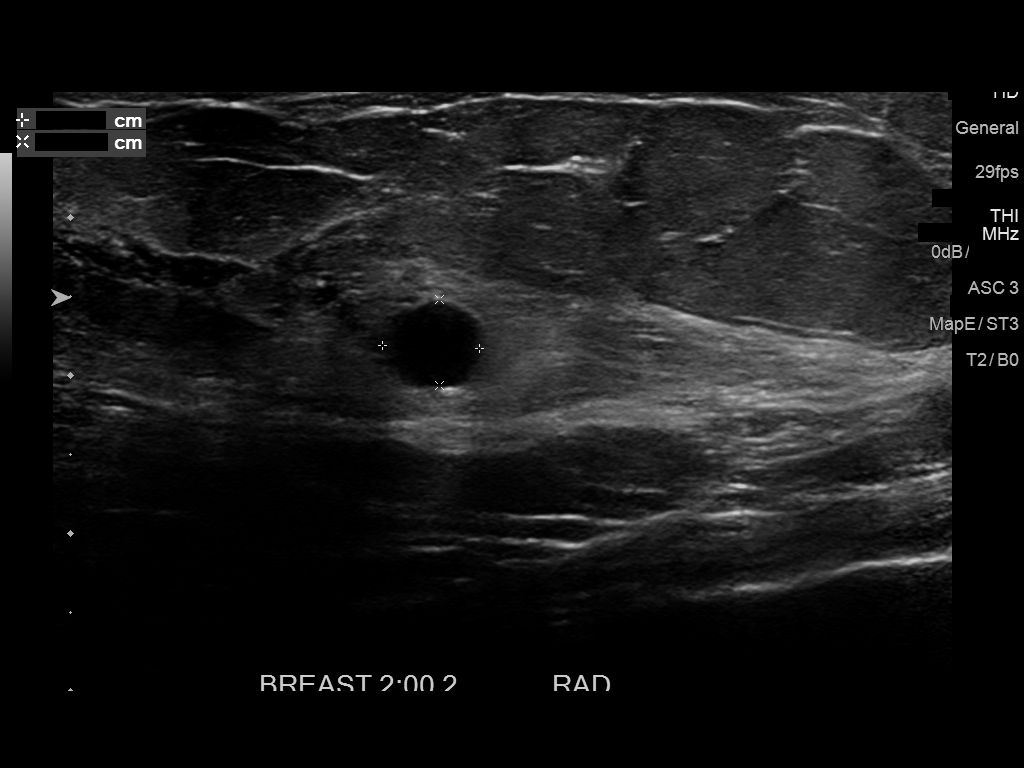
[im 3/5]
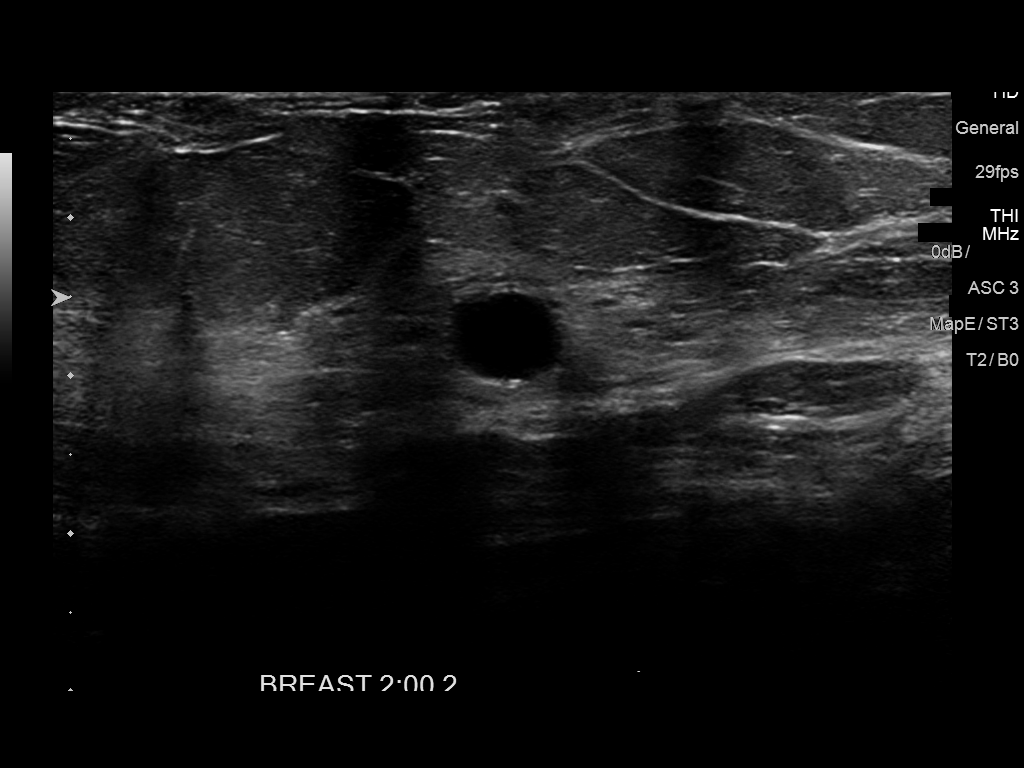
[im 4/5]
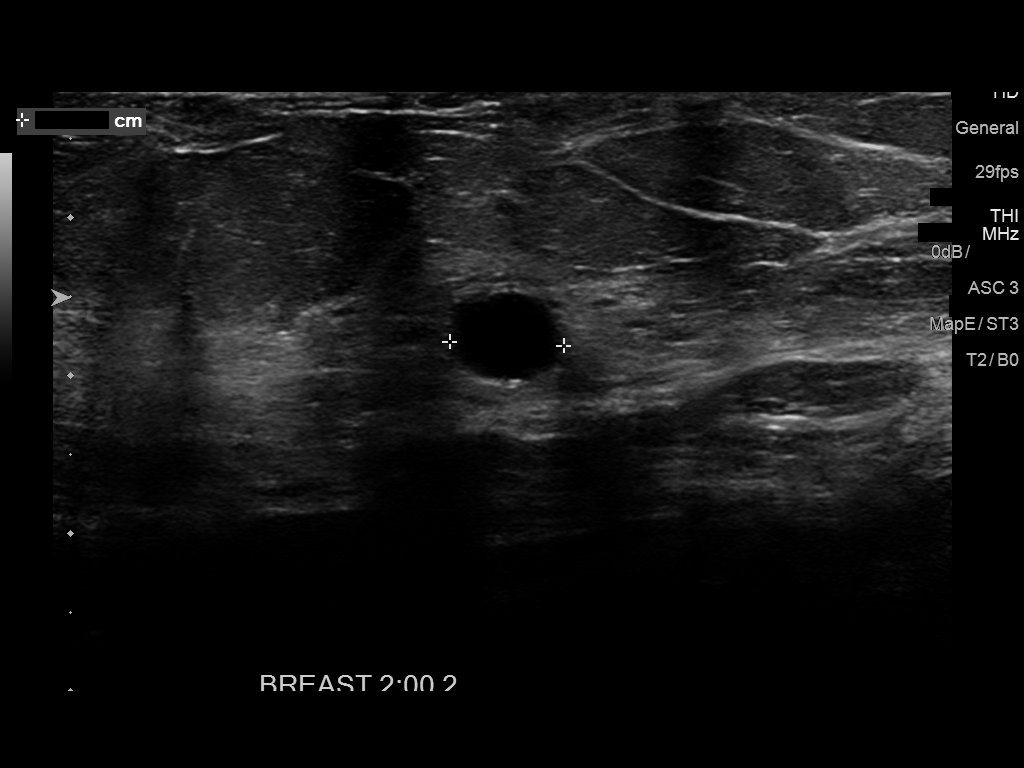
[im 5/5]
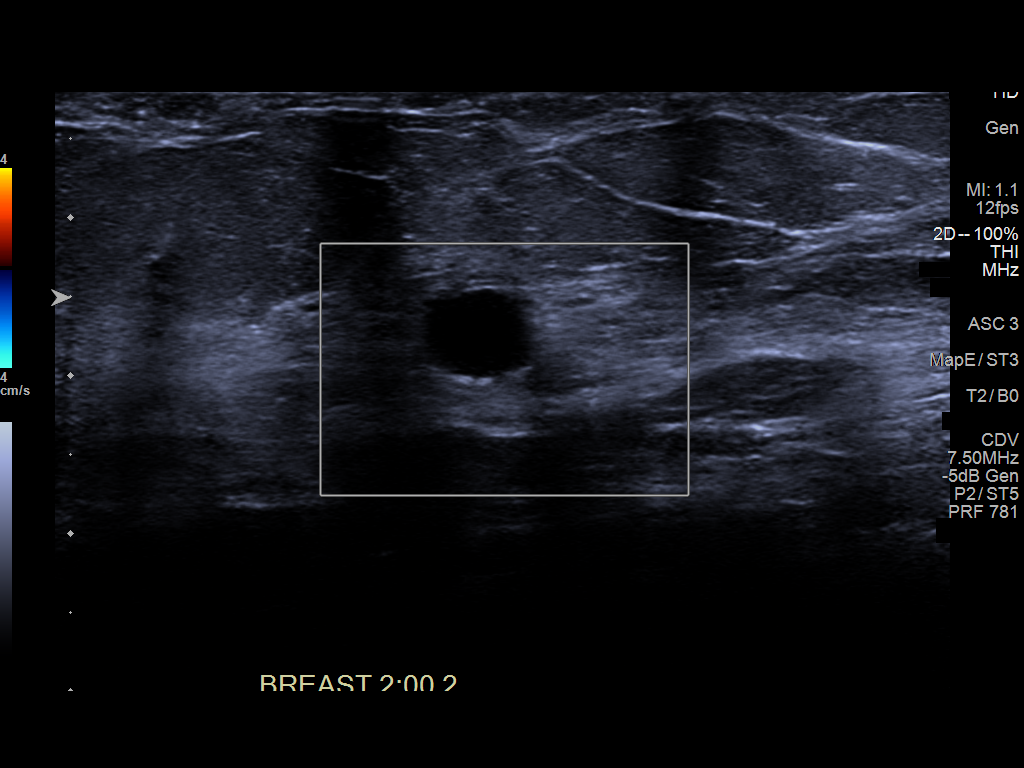

[5 of 5 positions shown; findings below may reference images not displayed]

ACR Breast Density Category b: There are scattered areas of
fibroglandular density.
FINDINGS: Whole breast CC and MLO views with tomography are performed,
confirming a circumscribed oval approximately 0.7 cm mass in the
upper outer periareolar left breast. Negative for architectural
distortion.

Mammographic images were processed with CAD.

On physical exam, no mass is palpated in the upper outer periareolar
left breast.

Targeted ultrasound is performed, showing a circumscribed oval
simple cyst measuring 0.7 x 0.6 x 0.5 cm at 2 o'clock position 2 cm
from nipple.
IMPRESSION: 0.7 cm simple cyst in the 2 o'clock region left breast accounts for
the mass seen on the recent screening mammogram. No evidence of
malignancy.

RECOMMENDATION:
Screening mammogram in one year.(Code:[id number]-C-XMS)

I have discussed the findings and recommendations with the patient.
Results were also provided in writing at the conclusion of the
visit. If applicable, a reminder letter will be sent to the patient
regarding the next appointment.

BI-RADS CATEGORY  2: Benign.

## 2020-03-25 DIAGNOSIS — E118 Type 2 diabetes mellitus with unspecified complications: Secondary | ICD-10-CM | POA: Diagnosis not present

## 2020-03-25 DIAGNOSIS — I10 Essential (primary) hypertension: Secondary | ICD-10-CM | POA: Diagnosis not present

## 2020-03-25 DIAGNOSIS — E785 Hyperlipidemia, unspecified: Secondary | ICD-10-CM | POA: Diagnosis not present

## 2020-03-25 DIAGNOSIS — K219 Gastro-esophageal reflux disease without esophagitis: Secondary | ICD-10-CM | POA: Diagnosis not present

## 2020-03-25 DIAGNOSIS — E1165 Type 2 diabetes mellitus with hyperglycemia: Secondary | ICD-10-CM | POA: Diagnosis not present

## 2020-05-03 DIAGNOSIS — I471 Supraventricular tachycardia: Secondary | ICD-10-CM | POA: Diagnosis not present

## 2020-05-03 DIAGNOSIS — I10 Essential (primary) hypertension: Secondary | ICD-10-CM | POA: Diagnosis not present

## 2020-05-03 DIAGNOSIS — E1165 Type 2 diabetes mellitus with hyperglycemia: Secondary | ICD-10-CM | POA: Diagnosis not present

## 2020-05-03 DIAGNOSIS — R600 Localized edema: Secondary | ICD-10-CM | POA: Diagnosis not present

## 2020-05-03 DIAGNOSIS — E782 Mixed hyperlipidemia: Secondary | ICD-10-CM | POA: Diagnosis not present

## 2020-05-03 DIAGNOSIS — I208 Other forms of angina pectoris: Secondary | ICD-10-CM | POA: Diagnosis not present

## 2020-05-03 DIAGNOSIS — E118 Type 2 diabetes mellitus with unspecified complications: Secondary | ICD-10-CM | POA: Diagnosis not present

## 2020-05-11 ENCOUNTER — Other Ambulatory Visit: Payer: Self-pay

## 2020-05-11 ENCOUNTER — Telehealth: Payer: Self-pay | Admitting: Cardiovascular Disease

## 2020-05-11 MED ORDER — METOPROLOL SUCCINATE ER 25 MG PO TB24: 25.0000 mg | ORAL_TABLET | Freq: Every day | ORAL | 0 refills | Status: DC

## 2020-05-11 NOTE — Telephone Encounter (Signed)
metoprolol succinate (TOPROL-XL) 25 MG 24 hr tablet 90 tablet 0 05/11/2020    Sig - Route: Take 1 tablet (25 mg total) by mouth daily. - Oral   Sent to pharmacy as: metoprolol succinate (TOPROL-XL) 25 MG 24 hr tablet   E-Prescribing Status: Receipt confirmed by pharmacy (05/11/2020  4:15 PM EDT)   [place], [place]

## 2020-05-11 NOTE — Telephone Encounter (Signed)
°*  STAT* If patient is at the pharmacy, call can be transferred to refill team.   1. Which medications need to be refilled? (please list name of each medication and dose if known)   Metoprolol 25 mg po q d ( patient confirmed she is only taking in am )    2. Which pharmacy/location (including street and city if local pharmacy) is medication to be sent to?  [facility]   3. Do they need a 30 day or 90 day supply?  [place]

## 2020-06-21 DIAGNOSIS — E1165 Type 2 diabetes mellitus with hyperglycemia: Secondary | ICD-10-CM | POA: Diagnosis not present

## 2020-06-21 DIAGNOSIS — I10 Essential (primary) hypertension: Secondary | ICD-10-CM | POA: Diagnosis not present

## 2020-06-21 DIAGNOSIS — E785 Hyperlipidemia, unspecified: Secondary | ICD-10-CM | POA: Diagnosis not present

## 2020-06-21 DIAGNOSIS — E118 Type 2 diabetes mellitus with unspecified complications: Secondary | ICD-10-CM | POA: Diagnosis not present

## 2020-06-29 DIAGNOSIS — E119 Type 2 diabetes mellitus without complications: Secondary | ICD-10-CM | POA: Diagnosis not present

## 2020-06-29 DIAGNOSIS — M47816 Spondylosis without myelopathy or radiculopathy, lumbar region: Secondary | ICD-10-CM | POA: Diagnosis not present

## 2020-06-29 DIAGNOSIS — I10 Essential (primary) hypertension: Secondary | ICD-10-CM | POA: Diagnosis not present

## 2020-06-29 DIAGNOSIS — R7989 Other specified abnormal findings of blood chemistry: Secondary | ICD-10-CM | POA: Diagnosis not present

## 2020-06-29 DIAGNOSIS — E782 Mixed hyperlipidemia: Secondary | ICD-10-CM | POA: Diagnosis not present

## 2020-06-29 DIAGNOSIS — K219 Gastro-esophageal reflux disease without esophagitis: Secondary | ICD-10-CM | POA: Diagnosis not present

## 2020-06-29 DIAGNOSIS — R600 Localized edema: Secondary | ICD-10-CM | POA: Diagnosis not present

## 2020-06-29 DIAGNOSIS — M5431 Sciatica, right side: Secondary | ICD-10-CM | POA: Diagnosis not present

## 2020-06-29 DIAGNOSIS — I878 Other specified disorders of veins: Secondary | ICD-10-CM | POA: Diagnosis not present

## 2020-06-29 DIAGNOSIS — I700 Atherosclerosis of aorta: Secondary | ICD-10-CM | POA: Diagnosis not present

## 2020-06-29 DIAGNOSIS — M419 Scoliosis, unspecified: Secondary | ICD-10-CM | POA: Diagnosis not present

## 2020-07-06 ENCOUNTER — Ambulatory Visit: Payer: PPO | Admitting: Cardiovascular Disease

## 2020-08-11 DIAGNOSIS — Z20822 Contact with and (suspected) exposure to covid-19: Secondary | ICD-10-CM | POA: Diagnosis not present

## 2020-08-11 DIAGNOSIS — J1282 Pneumonia due to coronavirus disease 2019: Secondary | ICD-10-CM | POA: Diagnosis not present

## 2020-08-11 DIAGNOSIS — Z03818 Encounter for observation for suspected exposure to other biological agents ruled out: Secondary | ICD-10-CM | POA: Diagnosis not present

## 2020-08-11 DIAGNOSIS — U071 COVID-19: Secondary | ICD-10-CM | POA: Diagnosis not present

## 2020-08-13 DIAGNOSIS — J208 Acute bronchitis due to other specified organisms: Secondary | ICD-10-CM | POA: Diagnosis not present

## 2020-08-13 DIAGNOSIS — E119 Type 2 diabetes mellitus without complications: Secondary | ICD-10-CM | POA: Diagnosis not present

## 2020-08-13 DIAGNOSIS — U071 COVID-19: Secondary | ICD-10-CM | POA: Diagnosis not present

## 2020-08-18 DIAGNOSIS — U071 COVID-19: Secondary | ICD-10-CM | POA: Diagnosis not present

## 2020-08-18 DIAGNOSIS — E119 Type 2 diabetes mellitus without complications: Secondary | ICD-10-CM | POA: Diagnosis not present

## 2020-08-18 DIAGNOSIS — J208 Acute bronchitis due to other specified organisms: Secondary | ICD-10-CM | POA: Diagnosis not present

## 2020-08-18 NOTE — Progress Notes (Deleted)
Cardiology Office Note  Date:  08/18/2020   ID:  Destiny Ramos, DOB 06-25-1941, MRN [id number]  PCP:  Donnamarie Rossetti, PA-C   No chief complaint on file.   HPI:  Destiny Ramos is a very pleasant 79 year old woman,  with a history of  palpitations and tachycardia, SVT hypertension,  Hyperlipidemia, fibromyalgia chronic venous insufficiency  Nonsmoker glucose elevated Previous episode of near syncope, fall with concussion several years ago She presents for routine followup of her tachycardia  Worsening leg swelling without HCTZ  On crestor, running out Legs are getting weaker No regular exercise program High carbohydrate diet  Checked her blood pressure at home was 140 before coming in to the office today High today in the office Very rare short episodes of tachycardia nothing significant  HCTZ recently held Reports legs are more swollen without the medication  Previously  Stopped the statin on her own Primary care started Crestor 20 mg daily which she reports having myalgias Taking fish oil  Lab work reviewed with hershowing creatinine 1.2 normal potassium total cholesterol 270 LDL 194 normal TSH low vitamin D hemoglobin A1c 6.7  EKG personally reviewed by myself on todays visit Normal sinus rhythm rate 67 bpm right bundle branch block  Other past medical history reviewed 10/07/[place] [employer] SVT with EMTs. Rate 220s Broke on its own  Prior Holter for 30 day event monitor showed  no significant arrhythmias, apart from APCs. She reports that she did not have significant symptoms while the monitors was being worn. 30 day monitor was started on April 20, 2010.   In early May 2013, she had orthostatic type symptoms with some heart racing.   Previous normal echocardiogram dated March 28, 2011 that showed normal LV systolic function, mild LVH, mild MR and TR    PMH:   has a past medical history of Chronic venous insufficiency, Diabetes mellitus  without complication ([place]), Fibromyalgia, Hyperlipidemia, Hypertension, Palpitations, and Tachycardia.  PSH:    Past Surgical History:  Procedure Laterality Date  . ABDOMINAL HYSTERECTOMY    . APPENDECTOMY    . CATARACT EXTRACTION, BILATERAL    . TONSILLECTOMY     Allergies as of 08/20/2020      Reactions   Zithromax [azithromycin] Rash      Medication List       Accurate as of August 18, 2020  3:16 PM. If you have any questions, ask your nurse or doctor.        aspirin 81 MG EC tablet Take 81 mg by mouth daily.   furosemide 20 MG tablet Commonly known as: LASIX Take 20 mg by mouth daily as needed.   lisinopril 5 MG tablet Commonly known as: ZESTRIL Take 5 mg by mouth daily.   metoprolol succinate 25 MG 24 hr tablet Commonly known as: TOPROL-XL Take 1 tablet (25 mg total) by mouth daily.   potassium chloride 10 MEQ tablet Commonly known as: KLOR-CON Take 1 tablet (10 mEq total) by mouth daily.   propranolol 20 MG tablet Commonly known as: INDERAL TAKE ONE TABLET THREE TIMES A DAY AS NEEDED   rosuvastatin 5 MG tablet Commonly known as: CRESTOR Take 1 tablet (5 mg total) by mouth daily.   Rybelsus 7 MG Tabs Generic drug: Semaglutide Take 1 tablet by mouth daily.   triamterene-hydrochlorothiazide 37.5-25 MG tablet Commonly known as: MAXZIDE-25 TAKE 1 TABLET BY MOUTH DAILY   Vitamin D (Ergocalciferol) 1.25 MG (50000 UNIT) Caps capsule Commonly known as: DRISDOL Take 50,000 Units  by mouth once a week.        Allergies:   Zithromax [azithromycin]   Social History:  The patient  reports that she has never smoked. She has never used smokeless tobacco. She reports current alcohol use. She reports that she does not use drugs.   Family History:   family history includes Cardiomyopathy in her father; Diabetes in her maternal aunt, maternal uncle, and maternal uncle.    Review of Systems: Review of Systems  Constitutional: Negative.        Weight gain    Respiratory: Negative.   Cardiovascular: Positive for leg swelling.       Tachycardia  Gastrointestinal: Negative.   Musculoskeletal: Negative.   Neurological: Negative.   Psychiatric/Behavioral: Negative.   All other systems reviewed and are negative.    PHYSICAL EXAM: VS:  There were no vitals taken for this visit. , BMI There is no height or weight on file to calculate BMI.  Constitutional:  oriented to person, place, and time. No distress.  HENT:  Head: Normocephalic and atraumatic.  Eyes:  no discharge. No scleral icterus.  Neck: Normal range of motion. Neck supple. No JVD present.  Cardiovascular: Normal rate, regular rhythm, normal heart sounds and intact distal pulses. Exam reveals no gallop and no friction rub. No edema No murmur heard. Pulmonary/Chest: Effort normal and breath sounds normal. No stridor. No respiratory distress.  no wheezes.  no rales.  no tenderness.  Abdominal: Soft.  no distension.  no tenderness.  Musculoskeletal: Normal range of motion.  no  tenderness or deformity.  Neurological:  normal muscle tone. Coordination normal. No atrophy Skin: Skin is warm and dry. No rash noted. not diaphoretic.  Psychiatric:  normal mood and affect. behavior is normal. Thought content normal.     Recent Labs: No results found for requested labs within last 8760 hours.    Lipid Panel No results found for: CHOL, HDL, LDLCALC, TRIG    Wt Readings from Last 3 Encounters:  01/23/20 191 lb (86.6 kg)  08/01/18 185 lb 6.4 oz (84.1 kg)  07/25/18 184 lb 9.6 oz (83.7 kg)       ASSESSMENT AND PLAN:  SVT  Documented by EMTs, episode at church, terminated on its own  Very rare episodes,  No changes to her medications no further workup needed  Essential hypertension - Blood pressure markedly elevated on today's visit Weight is up 10 pounds from clinic visit last year Recommended low carbohydrate diet We'll restart HCTZ given worsening leg swelling and blood  pressure  Pure hypercholesterolemia -  previously did not want a statin She has Crestor 20 but reports it causes myalgias We will restart Crestor at 5 mg daily  Morbid obesity ([place] Previously taking phentermine for weight loss Poor diet, no exercise Stressed importance of a walking program given her leg weakness   Total encounter time more than 25 minutes  Greater than 50% was spent in counseling and coordination of care with the patient  Disposition:   F/U  12 months   No orders of the defined types were placed in this encounter.   Signed, Esmond Plants, M.D., Ph.D. 08/18/2020  [place], [place]

## 2020-08-20 ENCOUNTER — Ambulatory Visit: Payer: PPO | Admitting: Cardiovascular Disease

## 2020-08-20 DIAGNOSIS — R55 Syncope and collapse: Secondary | ICD-10-CM

## 2020-08-20 DIAGNOSIS — I10 Essential (primary) hypertension: Secondary | ICD-10-CM

## 2020-08-20 DIAGNOSIS — I471 Supraventricular tachycardia: Secondary | ICD-10-CM

## 2020-08-20 DIAGNOSIS — E782 Mixed hyperlipidemia: Secondary | ICD-10-CM

## 2020-08-23 ENCOUNTER — Other Ambulatory Visit: Payer: Self-pay | Admitting: Cardiovascular Disease

## 2020-08-23 MED ORDER — METOPROLOL SUCCINATE ER 25 MG PO TB24: 25.0000 mg | ORAL_TABLET | Freq: Every day | ORAL | 0 refills | Status: AC

## 2020-08-23 NOTE — Telephone Encounter (Signed)
Rx request sent to pharmacy. Pt is scheduled for follow-up appointment with Dr. Rockey Situ on 09/10/20.

## 2020-08-23 NOTE — Telephone Encounter (Signed)
*  STAT* If patient is at the pharmacy, call can be transferred to refill team.   1. Which medications need to be refilled? (please list name of each medication and dose if known) metoprolol 1 tablet daily  2. Which pharmacy/location (including street and city if local pharmacy) is medication to be sent to? [facility]   3. Do they need a 30 day or 90 day supply? 90 day

## 2020-09-10 ENCOUNTER — Ambulatory Visit: Payer: PPO | Admitting: Cardiovascular Disease

## 2020-10-18 NOTE — Progress Notes (Deleted)
Cardiology Office Note  Date:  10/18/2020   ID:  Destiny Ramos, DOB 1941/09/18, MRN [id number]  PCP:  Destiny Rossetti, PA-C   No chief complaint on file.   HPI:  Destiny Ramos is a very pleasant 79 year old woman,  with a history of  palpitations and tachycardia, SVT hypertension,  Hyperlipidemia, fibromyalgia chronic venous insufficiency  Nonsmoker glucose elevated Previous episode of near syncope, fall with concussion several years ago She presents for routine followup of her tachycardia  Worsening leg swelling without Destiny Ramos  On Destiny Ramos, running out Legs are getting weaker No regular exercise program High carbohydrate diet  Checked her blood pressure at home was 140 before coming in to the office today High today in the office Very rare short episodes of tachycardia nothing significant  Destiny Ramos recently held Reports legs are more swollen without the medication  Previously  Stopped the statin on her own Primary care started Destiny Ramos 20 mg daily which she reports having myalgias Taking fish oil  Lab work reviewed with Destiny Ramos creatinine 1.2 normal potassium total cholesterol 270 LDL 194 normal TSH low vitamin D hemoglobin A1c 6.7  EKG personally reviewed by myself on todays visit Normal sinus rhythm rate 67 bpm right bundle branch block  Other past medical history reviewed 10/07/[place] [facility] [employer] SVT with [facility]s. Rate 220s Broke on its own  Prior Holter for 30 day event monitor showed  no significant arrhythmias, apart from APCs. She reports that she did not have significant symptoms while the monitors was being worn. 30 day monitor was started on April 20, 2010.   In early May 2013, she had orthostatic type symptoms with some heart racing.   Previous normal echocardiogram dated March 28, 2011 that showed normal LV systolic function, mild LVH, mild MR and TR    PMH:   has a past medical history of Chronic venous insufficiency, Diabetes mellitus  without complication (Destiny Ramos), Fibromyalgia, Hyperlipidemia, Hypertension, Palpitations, and Tachycardia.  PSH:    Past Surgical History:  Procedure Laterality Date  . ABDOMINAL HYSTERECTOMY    . APPENDECTOMY    . CATARACT EXTRACTION, BILATERAL    . TONSILLECTOMY     Allergies as of 10/20/2020      Reactions   Zithromax [azithromycin] Rash      Medication List       Accurate as of October 18, 2020  6:03 PM. If you have any questions, ask your nurse or doctor.        aspirin 81 MG EC tablet Take 81 mg by mouth daily.   furosemide 20 MG tablet Commonly known as: Destiny Ramos Take 20 mg by mouth daily as needed.   lisinopril 5 MG tablet Commonly known as: ZESTRIL Take 5 mg by mouth daily.   metoprolol succinate 25 MG 24 hr tablet Commonly known as: TOPROL-XL Take 1 tablet (25 mg total) by mouth daily. Please keep your upcoming appointment for refills.   potassium chloride 10 MEQ tablet Commonly known as: KLOR-CON Take 1 tablet (10 mEq total) by mouth daily.   propranolol 20 MG tablet Commonly known as: INDERAL TAKE ONE TABLET THREE TIMES A DAY AS NEEDED   rosuvastatin 5 MG tablet Commonly known as: Destiny Ramos Take 1 tablet (5 mg total) by mouth daily.   Destiny Ramos 7 MG Tabs Generic drug: Semaglutide Take 1 tablet by mouth daily.   triamterene-hydrochlorothiazide 37.5-25 MG tablet Commonly known as: MAXZIDE-25 TAKE 1 TABLET BY MOUTH DAILY   Vitamin D (Ergocalciferol) 1.25 MG (50000 UNIT) Caps capsule  Commonly known as: DRISDOL Take 50,000 Units by mouth once a week.        Allergies:   Zithromax [azithromycin]   Social History:  The patient  reports that she has never smoked. She has never used smokeless tobacco. She reports current alcohol use. She reports that she does not use drugs.   Family History:   family history includes Cardiomyopathy in her father; Diabetes in her maternal aunt, maternal uncle, and maternal uncle.    Review of Systems: Review of  Systems  Constitutional: Negative.        Weight gain  Respiratory: Negative.   Cardiovascular: Positive for leg swelling.       Tachycardia  Gastrointestinal: Negative.   Musculoskeletal: Negative.   Neurological: Negative.   Psychiatric/Behavioral: Negative.   All other systems reviewed and are negative.    PHYSICAL EXAM: VS:  There were no vitals taken for this visit. , BMI There is no height or weight on file to calculate BMI.  Constitutional:  oriented to person, place, and time. No distress.  HENT:  Head: Normocephalic and atraumatic.  Eyes:  no discharge. No scleral icterus.  Neck: Normal range of motion. Neck supple. No JVD present.  Cardiovascular: Normal rate, regular rhythm, normal heart sounds and intact distal pulses. Exam reveals no gallop and no friction rub. No edema No murmur heard. Pulmonary/Chest: Effort normal and breath sounds normal. No stridor. No respiratory distress.  no wheezes.  no rales.  no tenderness.  Abdominal: Soft.  no distension.  no tenderness.  Musculoskeletal: Normal range of motion.  no  tenderness or deformity.  Neurological:  normal muscle tone. Coordination normal. No atrophy Skin: Skin is warm and dry. No rash noted. not diaphoretic.  Psychiatric:  normal mood and affect. behavior is normal. Thought content normal.     Recent Labs: No results found for requested labs within last 8760 hours.    Lipid Panel No results found for: CHOL, HDL, LDLCALC, TRIG    Wt Readings from Last 3 Encounters:  01/23/20 191 lb (86.6 kg)  08/01/18 185 lb 6.4 oz (84.1 kg)  07/25/18 184 lb 9.6 oz (83.7 kg)       ASSESSMENT AND PLAN:  SVT  Documented by EMTs, episode at church, terminated on its own  Very rare episodes,  No changes to her medications no further workup needed  Essential hypertension - Blood pressure markedly elevated on today's visit Weight is up 10 pounds from clinic visit last year Recommended low carbohydrate diet We'll  restart Destiny Ramos given worsening leg swelling and blood pressure  Pure hypercholesterolemia -  previously did not want a statin She has Destiny Ramos 20 but reports it causes myalgias We will restart Destiny Ramos at 5 mg daily  Morbid obesity (HCC Previously taking phentermine for weight loss Poor diet, no exercise Stressed importance of a walking program given her leg weakness   Total encounter time more than 25 minutes  Greater than 50% was spent in counseling and coordination of care with the patient  Disposition:   F/U  12 months   No orders of the defined types were placed in this encounter.   Signed, Dossie Arbour, M.D., Ph.D. 10/18/2020  [facility] [place], [place] [phone]

## 2020-10-20 ENCOUNTER — Ambulatory Visit: Payer: PPO | Admitting: Cardiovascular Disease

## 2020-11-23 ENCOUNTER — Other Ambulatory Visit: Payer: Self-pay | Admitting: Cardiovascular Disease

## 2020-11-24 NOTE — Telephone Encounter (Signed)
I think it would be appropriate that the cardiologist at [facility] refills her medications r/t her heart meds if her care is at their clinic and she has canceled the past 10 visit with [facility].  Thanks [employer], RN

## 2020-11-24 NOTE — Telephone Encounter (Signed)
Looks like this pt is seeing Dr. Leslye Peer at [facility]. Has cancelled the last 10 scheduled appointment with Dr. Rockey Situ or APP. She would need to defer refills to Dr. Alveria Apley office.

## 2021-02-23 ENCOUNTER — Ambulatory Visit: Payer: Self-pay | Admitting: *Deleted

## 2021-02-28 ENCOUNTER — Encounter: Payer: Self-pay | Admitting: *Deleted

## 2021-02-28 ENCOUNTER — Encounter: Payer: HMO | Attending: Family Medicine | Admitting: *Deleted

## 2021-02-28 ENCOUNTER — Other Ambulatory Visit: Payer: Self-pay

## 2021-02-28 VITALS — BP 160/84 | Ht 62.0 in | Wt 183.5 lb

## 2021-02-28 DIAGNOSIS — E119 Type 2 diabetes mellitus without complications: Secondary | ICD-10-CM | POA: Diagnosis not present

## 2021-02-28 NOTE — Progress Notes (Signed)
Diabetes Self-Management Education  Visit Type: First/Initial  Appt. Start Time: 1045 Appt. End Time: 1150  02/28/2021  Ms. Destiny Ramos, identified by name and date of birth, is a 80 y.o. female with a diagnosis of Diabetes: Type 2.   ASSESSMENT  Blood pressure (!) 160/84, height 5\' 2"  (1.575 m), weight 183 lb 8 oz (83.2 kg). Body mass index is 33.56 kg/m.   Diabetes Self-Management Education - 02/28/21 1205      Visit Information   Visit Type First/Initial      Initial Visit   Diabetes Type Type 2    Are you currently following a meal plan? No    Are you taking your medications as prescribed? Yes    Date Diagnosed 3 years ago      Health Coping   How would you rate your overall health? Good      Psychosocial Assessment   Patient Belief/Attitude about Diabetes Motivated to manage diabetes   "I am ok with it"   Self-care barriers None    Self-management support Doctor's office;Family;Friends    Patient Concerns Nutrition/Meal planning;Glycemic Control;Weight Control;Healthy Lifestyle    Special Needs None    Preferred Learning Style Auditory;Visual    Learning Readiness Ready    How often do you need to have someone help you when you read instructions, pamphlets, or other written materials from your doctor or pharmacy? 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 Needs Review    Patient understands incorporating nutritional management into lifestyle. Needs Review    Patient undertands incorporating physical activity into lifestyle. Needs Review    Patient understands using medications safely. Needs Review    Patient understands monitoring blood glucose, interpreting and using results Needs Review    Patient understands prevention, detection, and treatment of acute complications. Needs Review    Patient understands prevention,  detection, and treatment of chronic complications. Needs Review    Patient understands how to develop strategies to address psychosocial issues. Needs Review    Patient understands how to develop strategies to promote health/change behavior. Needs Review      Complications   Last HgB A1C per patient/outside source 7 %   06/21/2020   How often do you check your blood sugar? 3-4 times/day   hasn't checked in last week due to "sensor fell off" she is going today to get new one (using [employer]) 30 day average of 138 mg/dL and 90 average 132 mg/dL.   Fasting Blood glucose range (mg/dL) 70-129;130-179   114-139 mg/dL   Postprandial Blood glucose range (mg/dL) 130-179;180-200   pp's 135-184 mg/dL   Number of hypoglycemic episodes per month 1   lowest reading of 77 mg/dL   Can you tell when your blood sugar is low? Yes    What do you do if your blood sugar is low? drink something "sweet" and ate    Have you had a dilated eye exam in the past 12 months? Yes    Have you had a dental exam in the past 12 months? Yes    Are you checking your feet? Yes    How many days per week are you checking your feet? 4      Dietary Intake   Breakfast 1/2 cup cereal, milk, poptart, toast, eggs, bacon, 1/2 orange    Lunch  meat and veggies (see dinner)    Snack (afternoon) 2 snacks/day - chips, peanut butter crackers    Dinner chicken, hamburger, salmon, pork, potatoes, peas, beans, corn, rice, pasta, green beans, greens, salads - lettuce, tomatoes, onions, cuccumbers    Snack (evening) 1/2 apple    Beverage(s) water, coffee, unsweetened tea      Exercise   Exercise Type Light (walking / raking leaves)    How many days per week to you exercise? 5    How many minutes per day do you exercise? 20    Total minutes per week of exercise 100      Patient Education   Previous Diabetes Education Yes (please comment)   Diabetes classes in 2019   Disease state  Explored patient's options for treatment of their  diabetes    Nutrition management  Role of diet in the treatment of diabetes and the relationship between the three main macronutrients and blood glucose level;Food label reading, portion sizes and measuring food.;Reviewed blood glucose goals for pre and post meals and how to evaluate the patients' food intake on their blood glucose level.;Meal timing in regards to the patients' current diabetes medication.    Physical activity and exercise  Role of exercise on diabetes management, blood pressure control and cardiac health.    Medications Reviewed patients medication for diabetes, action, purpose, timing of dose and side effects.    Monitoring Purpose and frequency of SMBG.;Taught/discussed recording of test results and interpretation of SMBG.;Identified appropriate SMBG and/or A1C goals.;Other (comment)   discussed CGM goals - time in range, above and below range   Acute complications Taught treatment of hypoglycemia - the 15 rule.    Chronic complications Relationship between chronic complications and blood glucose control    Psychosocial adjustment Identified and addressed patients feelings and concerns about diabetes      Individualized Goals (developed by patient)   Reducing Risk Other (comment)   improve blood sugars, lose weight, lead a healthier lifestyle     Outcomes   Expected Outcomes Demonstrated interest in learning. Expect positive outcomes    Future DMSE 2 wks           Individualized Plan for Diabetes Self-Management Training:   Learning Objective:  Patient will have a greater understanding of diabetes self-management. Patient education plan is to attend individual and/or group sessions per assessed needs and concerns.   Plan:   Patient Instructions  Check blood sugars 2 x day before breakfast and 2 hrs after supper every day and as needed with CGM Bring CGM reader to the next appointment  Exercise: Continue walking for  20  minutes  5 days a week  Eat 3 meals day,    1-2  snacks a day Space meals 4-6 hours apart Include 1 serving of protein with each meal and when eating fruit for a snack  Complete 3 Day Food Record and bring to next appt  Carry fast acting glucose and a snack at all times  Return for appointment on:  Wednesday Mar 16, 2021 at 10:30 am with Freda Munro (nurse)   Expected Outcomes:  Demonstrated interest in learning. Expect positive outcomes  Education material provided:  General Meal Planning Guidelines Simple Meal Plan 3 Day Food Record Symptoms, causes and treatments of Hypoglycemia  If problems or questions, patient to contact team via:  Johny Drilling, RN, [place] [phone]  Future DSME appointment: 2 wks  Mar 16, 2021 with the nurse

## 2021-02-28 NOTE — Patient Instructions (Addendum)
Check blood sugars 2 x day before breakfast and 2 hrs after supper every day and as needed with CGM Bring CGM reader to the next appointment  Exercise: Continue walking for  20  minutes  5 days a week  Eat 3 meals day,   1-2  snacks a day Space meals 4-6 hours apart Include 1 serving of protein with each meal and when eating fruit for a snack  Complete 3 Day Food Record and bring to next appt  Carry fast acting glucose and a snack at all times  Return for appointment on:  Wednesday Mar 16, 2021 at 10:30 am with Freda Munro (nurse)

## 2021-03-16 ENCOUNTER — Ambulatory Visit: Payer: HMO | Admitting: *Deleted

## 2021-03-30 ENCOUNTER — Encounter: Payer: Self-pay | Admitting: *Deleted

## 2021-03-30 NOTE — Progress Notes (Signed)
Patient cancelled her apointment for Diabetes education scheduled for 03/16/2021. She left a message that "something came up". Will discharge if she doesn't reschedule this month.

## 2021-04-15 ENCOUNTER — Encounter: Payer: Self-pay | Admitting: *Deleted

## 2021-04-26 DIAGNOSIS — H902 Conductive hearing loss, unspecified: Secondary | ICD-10-CM | POA: Diagnosis not present

## 2021-04-26 DIAGNOSIS — H6123 Impacted cerumen, bilateral: Secondary | ICD-10-CM | POA: Diagnosis not present

## 2021-04-28 DIAGNOSIS — E782 Mixed hyperlipidemia: Secondary | ICD-10-CM | POA: Diagnosis not present

## 2021-04-28 DIAGNOSIS — R7989 Other specified abnormal findings of blood chemistry: Secondary | ICD-10-CM | POA: Diagnosis not present

## 2021-04-28 DIAGNOSIS — N1831 Chronic kidney disease, stage 3a: Secondary | ICD-10-CM | POA: Diagnosis not present

## 2021-04-28 DIAGNOSIS — I471 Supraventricular tachycardia: Secondary | ICD-10-CM | POA: Diagnosis not present

## 2021-04-28 DIAGNOSIS — E119 Type 2 diabetes mellitus without complications: Secondary | ICD-10-CM | POA: Diagnosis not present

## 2021-04-28 DIAGNOSIS — K219 Gastro-esophageal reflux disease without esophagitis: Secondary | ICD-10-CM | POA: Diagnosis not present

## 2021-04-28 DIAGNOSIS — I10 Essential (primary) hypertension: Secondary | ICD-10-CM | POA: Diagnosis not present

## 2021-08-26 IMAGING — US US EXTREM LOW VENOUS
1 series · 13 of 24 positions shown · non-contrast
Comparison: None.

CLINICAL DATA: Bilateral leg edema.



[Series 1: us extrem low venous · 0.08mm/px · 13 of 60 slices shown]
[im 1/60]
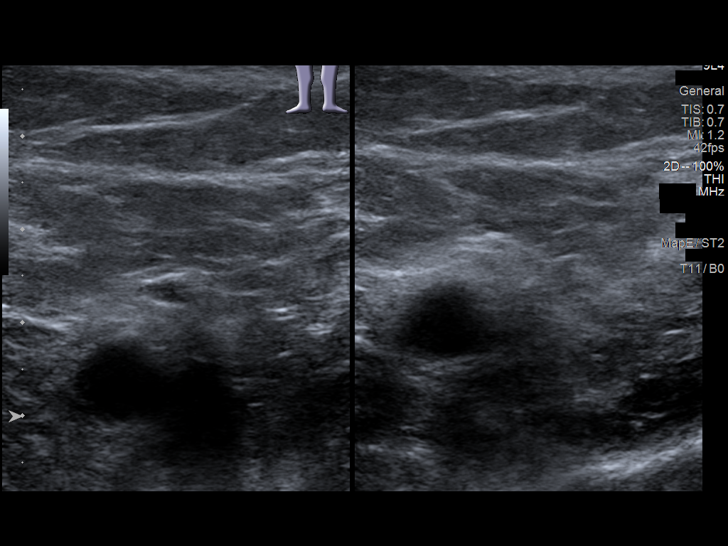
[im 6/60]
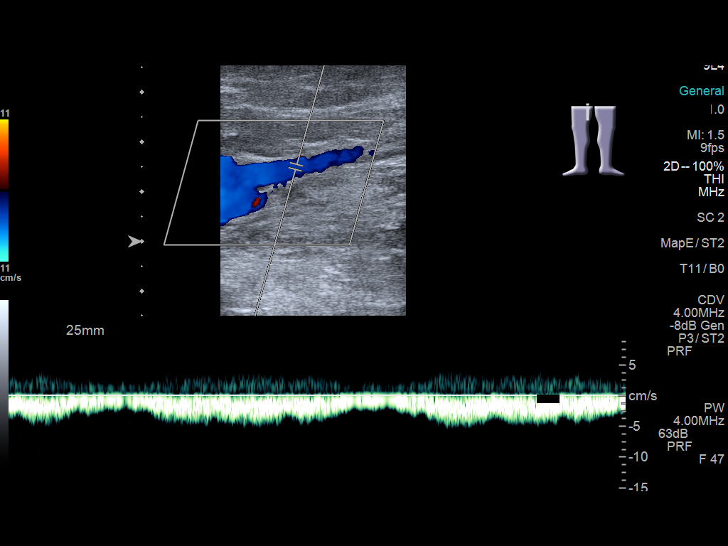
[im 11/60]
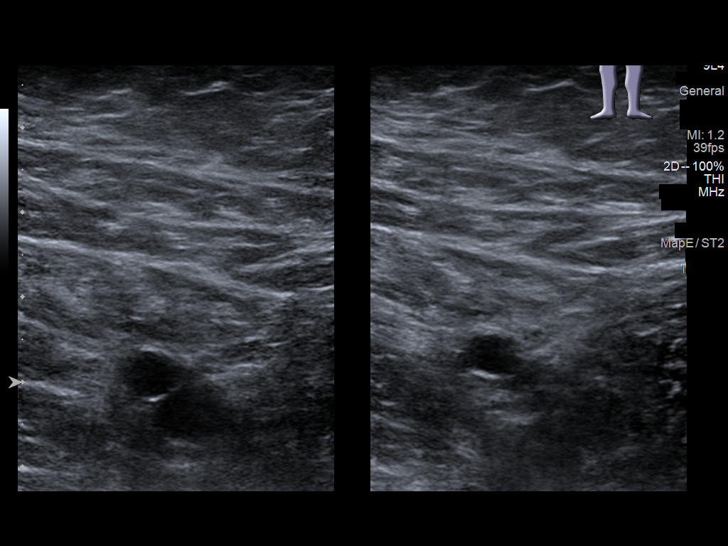
[im 16/60]
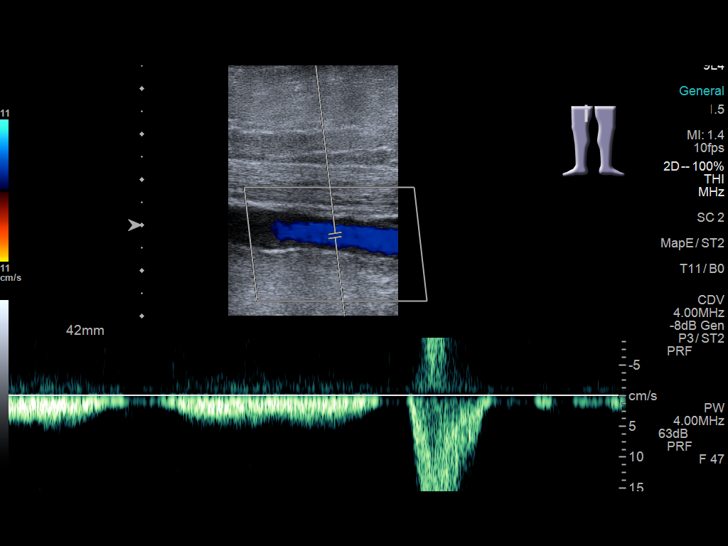
[im 21/60]
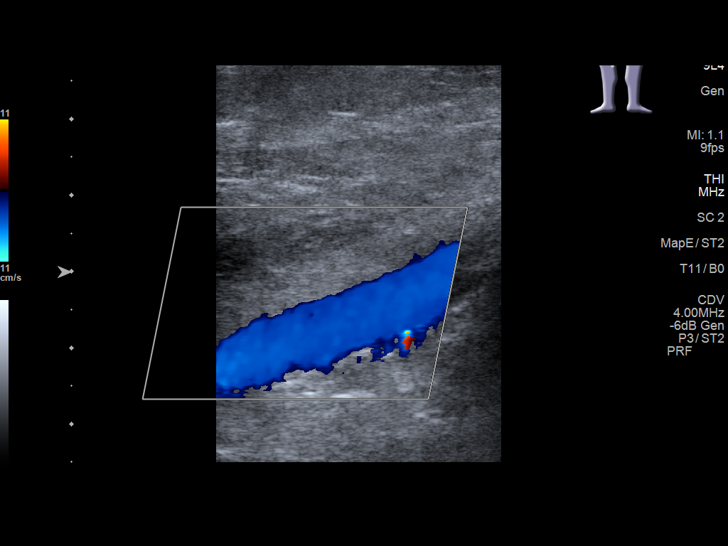
[im 26/60]
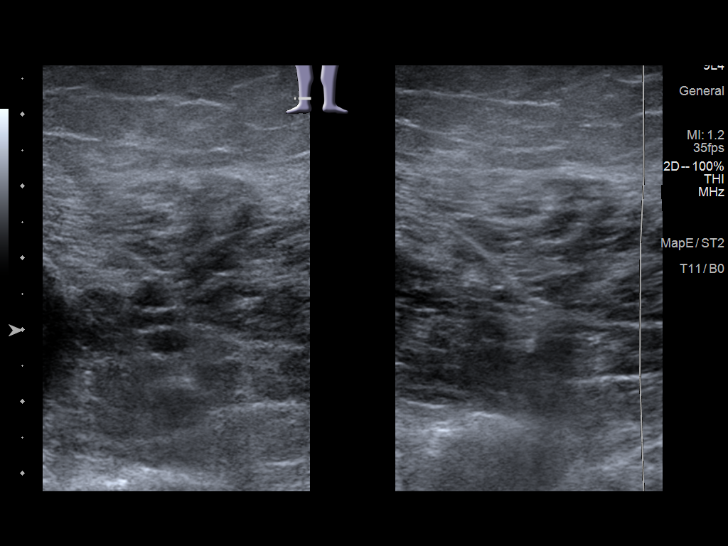
[im 31/60]
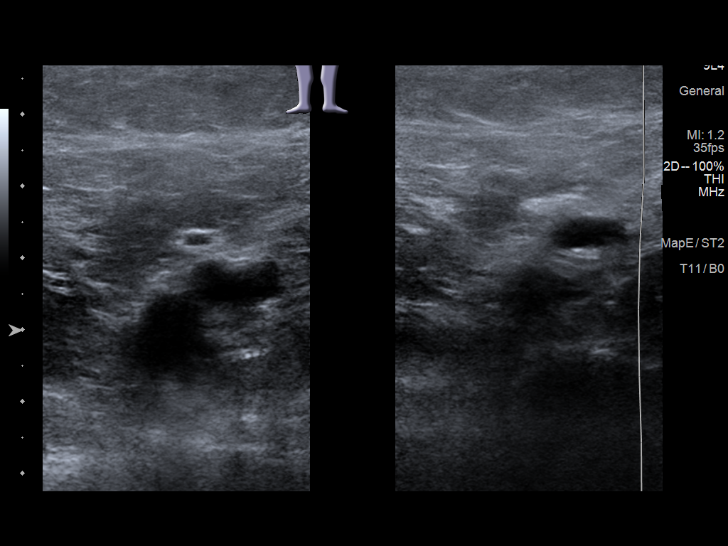
[im 34/60]
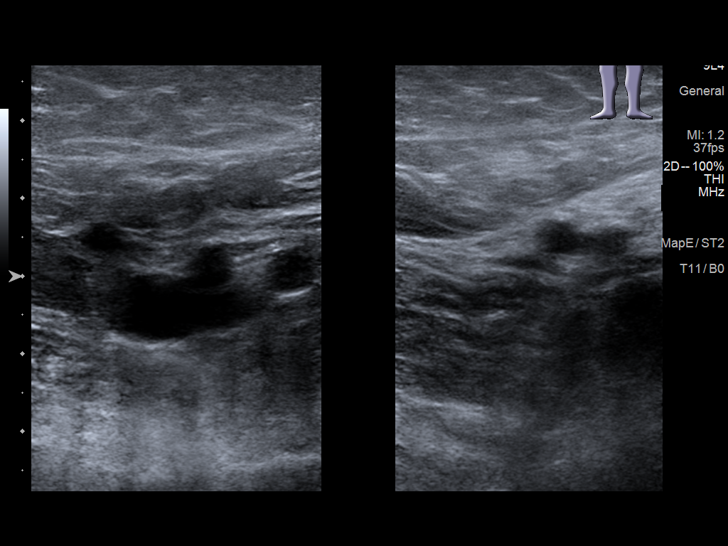
[im 39/60]
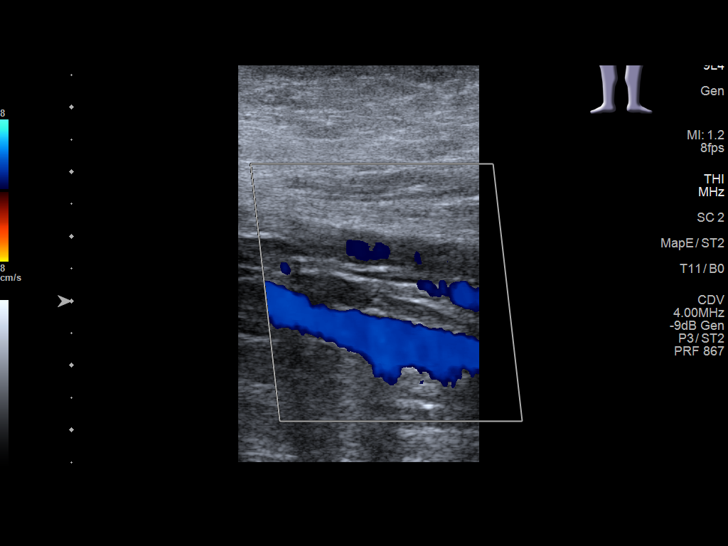
[im 44/60]
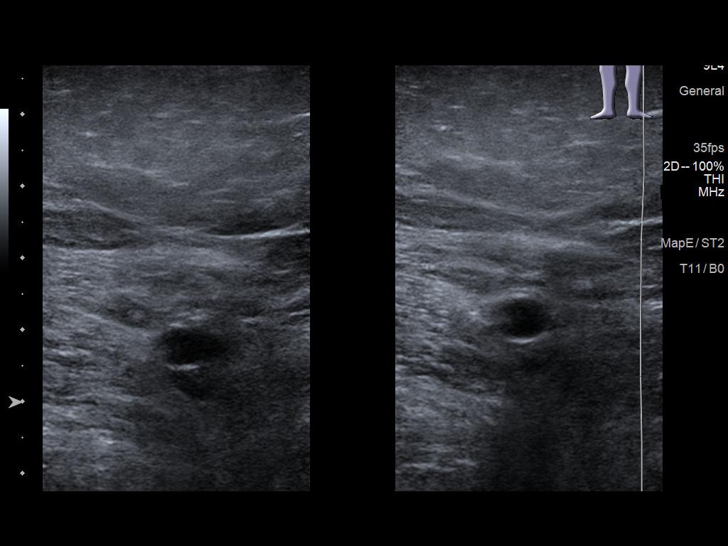
[im 49/60]
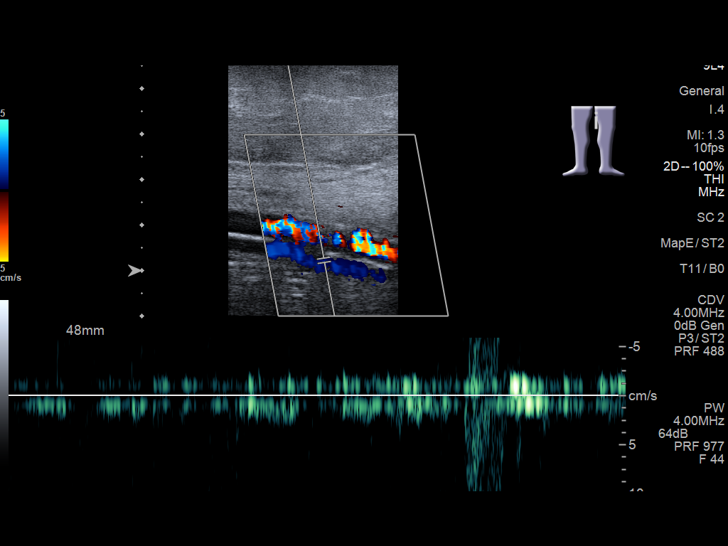
[im 54/60]
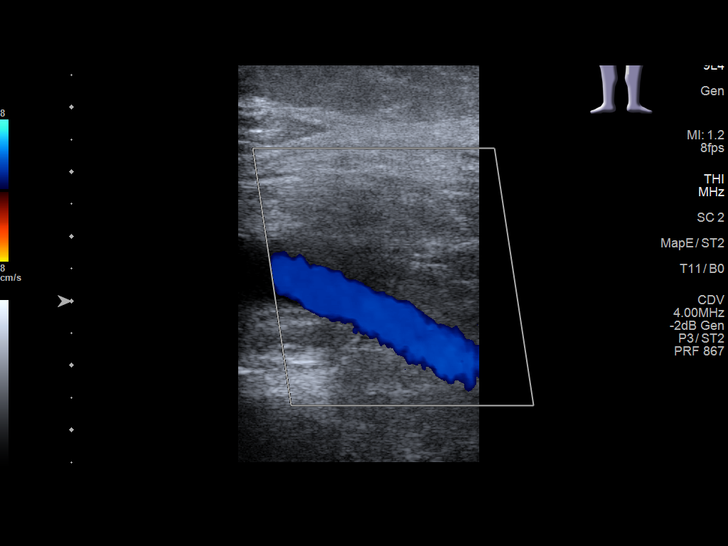
[im 60/60]
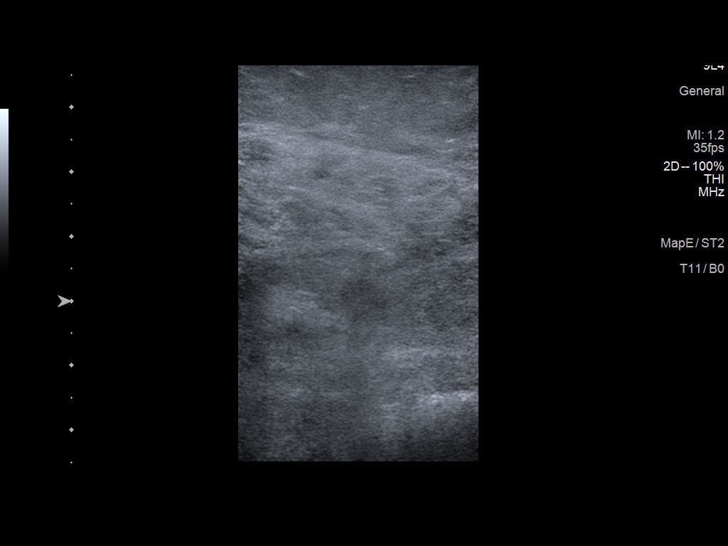

[13 of 24 positions shown; findings below may reference images not displayed]

FINDINGS: RIGHT LOWER EXTREMITY

Common Femoral Vein: No evidence of thrombus. Normal
compressibility, respiratory phasicity and response to augmentation.

Saphenofemoral Junction: No evidence of thrombus. Normal
compressibility and flow on color Doppler imaging.

Profunda Femoral Vein: No evidence of thrombus. Normal
compressibility and flow on color Doppler imaging.

Femoral Vein: No evidence of thrombus. Normal compressibility,
respiratory phasicity and response to augmentation.

Popliteal Vein: No evidence of thrombus. Normal compressibility,
respiratory phasicity and response to augmentation.

Calf Veins: No evidence of thrombus. Normal compressibility and flow
on color Doppler imaging.

Superficial Great Saphenous Vein: No evidence of thrombus. Normal
compressibility.

Venous Reflux:  None.

Other Findings:  None.

LEFT LOWER EXTREMITY

Common Femoral Vein: No evidence of thrombus. Normal
compressibility, respiratory phasicity and response to augmentation.

Saphenofemoral Junction: No evidence of thrombus. Normal
compressibility and flow on color Doppler imaging.

Profunda Femoral Vein: No evidence of thrombus. Normal
compressibility and flow on color Doppler imaging.

Femoral Vein: No evidence of thrombus. Normal compressibility,
respiratory phasicity and response to augmentation.

Popliteal Vein: No evidence of thrombus. Normal compressibility,
respiratory phasicity and response to augmentation.

Calf Veins: No evidence of thrombus. Normal compressibility and flow
on color Doppler imaging.

Superficial Great Saphenous Vein: No evidence of thrombus. Normal
compressibility.

Venous Reflux:  None.

Other Findings:  None.
IMPRESSION: No evidence of deep venous thrombosis in either lower extremity.

## 2021-09-19 ENCOUNTER — Telehealth: Payer: Self-pay | Admitting: Family Medicine

## 2021-09-19 NOTE — Telephone Encounter (Signed)
Patient would like to become a new patient with Dr Caryn Section. Her son sees Dr Caryn Section. She is not having any health problems right now but would like to been seen by someone in your clinic around the first of the new year. She is willing to get in with any provider.

## 2021-09-19 NOTE — Telephone Encounter (Signed)
I'm not accepting patients. I don't think anyone is right now.

## 2021-11-01 IMAGING — US US EXTREM LOW VENOUS*R*
1 series · 14 of 24 positions shown · non-contrast
Comparison: 11/17/2019

CLINICAL DATA: Swelling and pain, right calf and knee x1 day

EXAM:
RIGHT LOWER EXTREMITY VENOUS DOPPLER ULTRASOUND
TECHNIQUE: Gray-scale sonography with compression, as well as color and duplex
ultrasound, were performed to evaluate the deep venous system(s)
from the level of the common femoral vein through the popliteal and
proximal calf veins.

[Series 1: us extrem low venous*right* · 0.08mm/px · 14 of 33 slices shown]
[im 1/33]
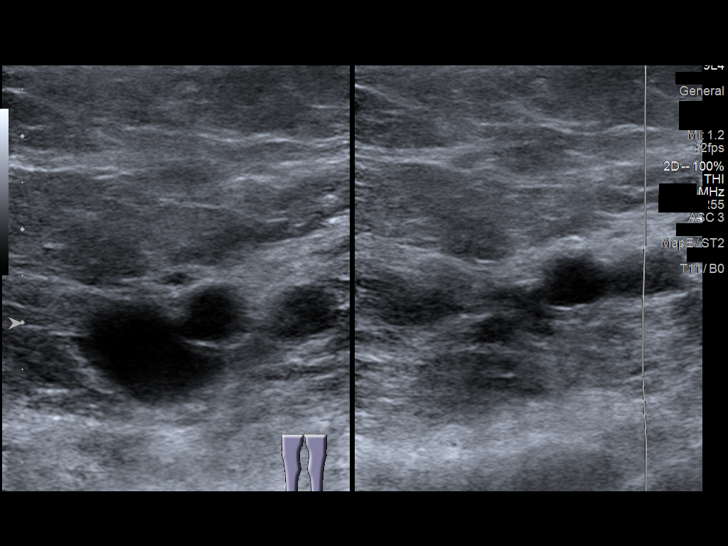
[im 3/33]
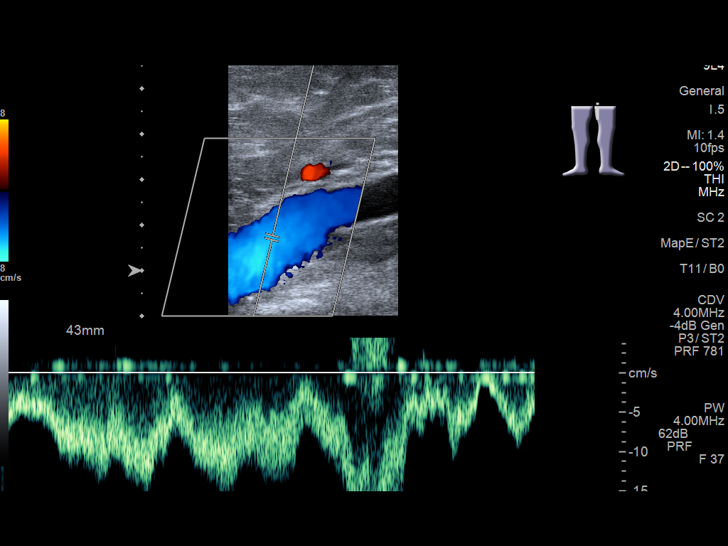
[im 6/33]
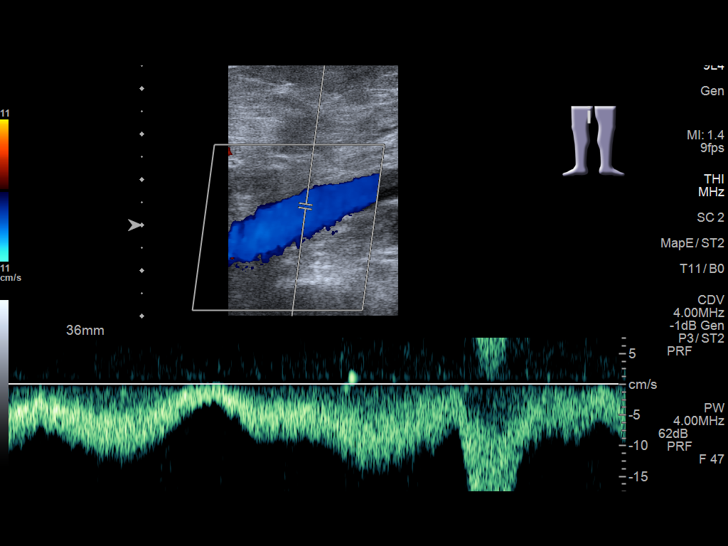
[im 9/33]
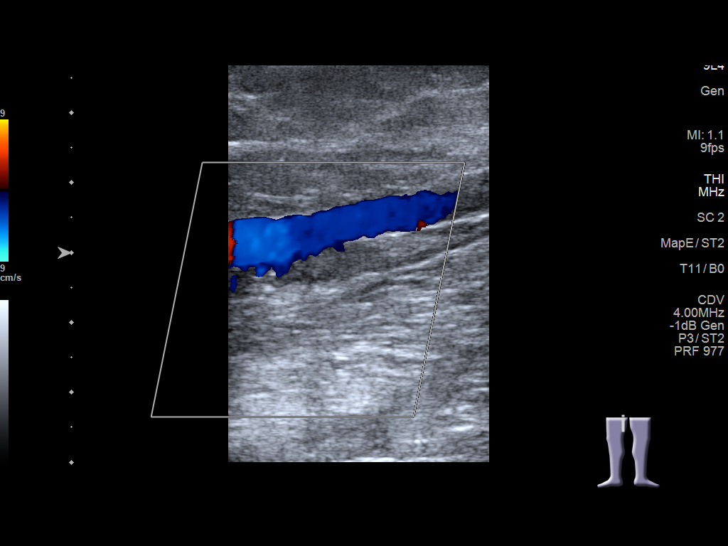
[im 10/33]
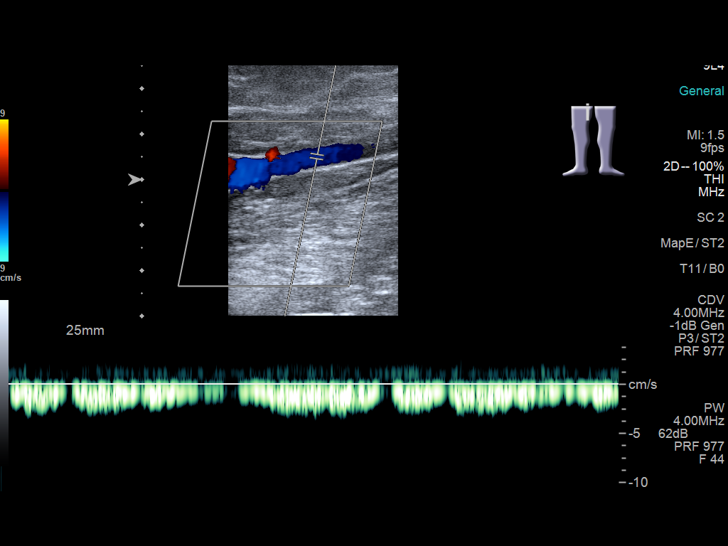
[im 13/33]
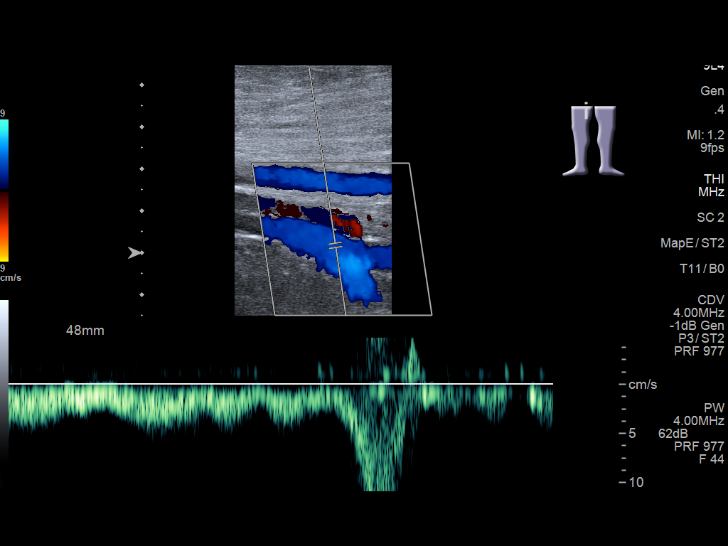
[im 16/33]
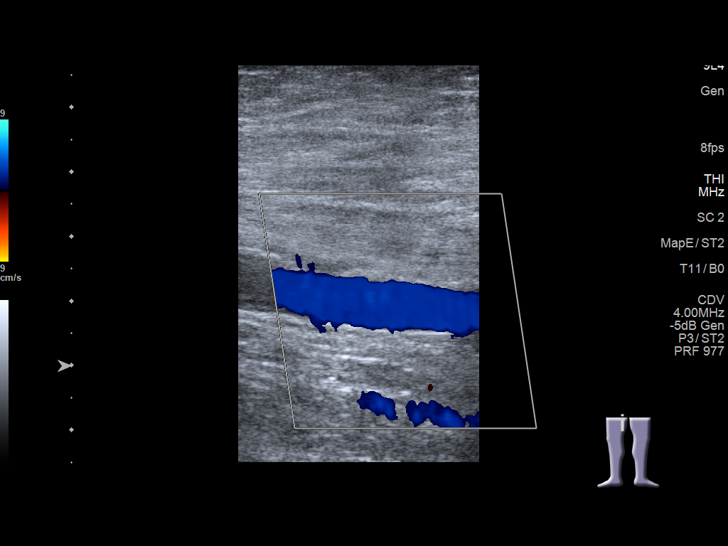
[im 17/33]
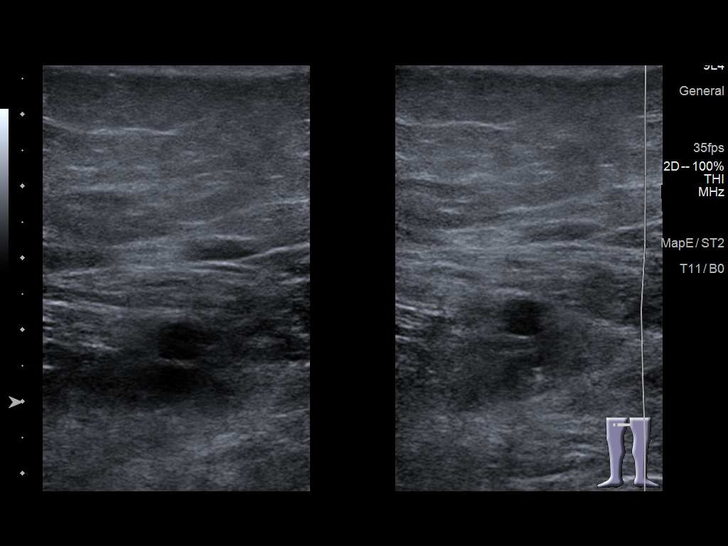
[im 20/33]
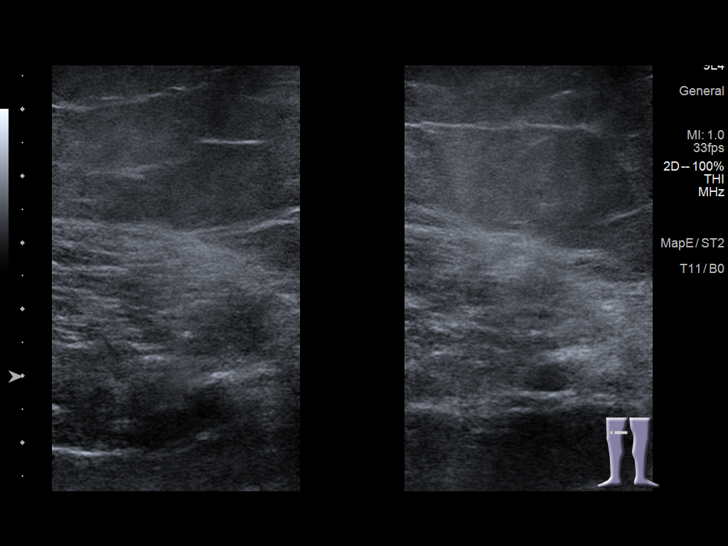
[im 23/33]
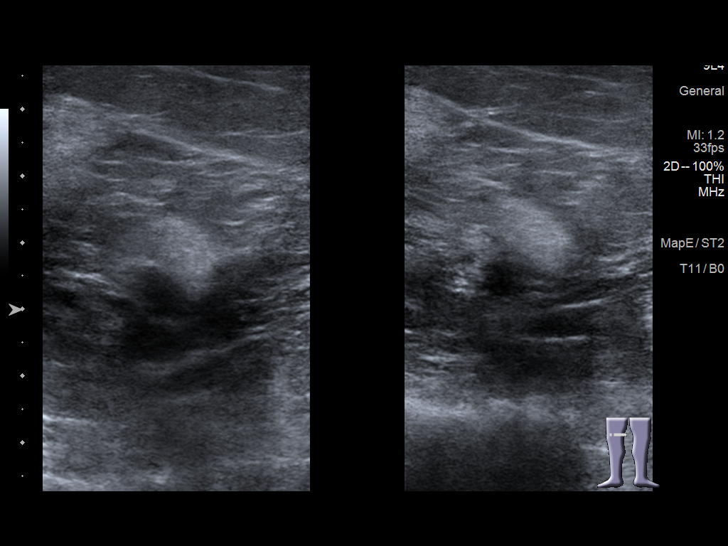
[im 26/33]
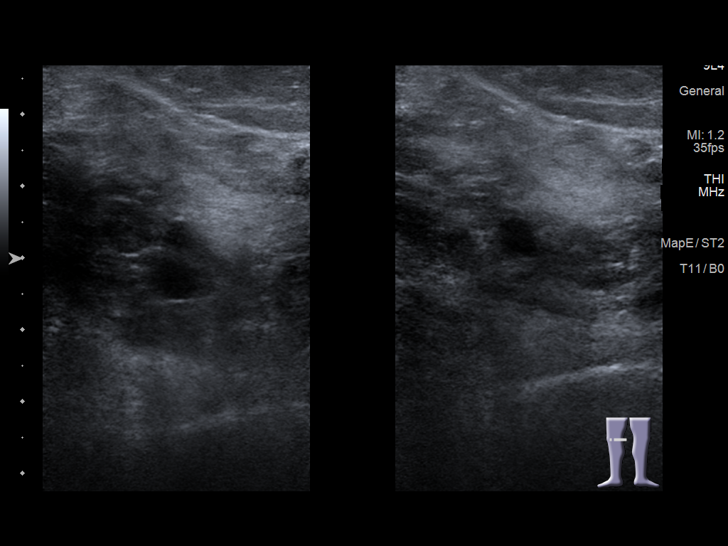
[im 27/33]
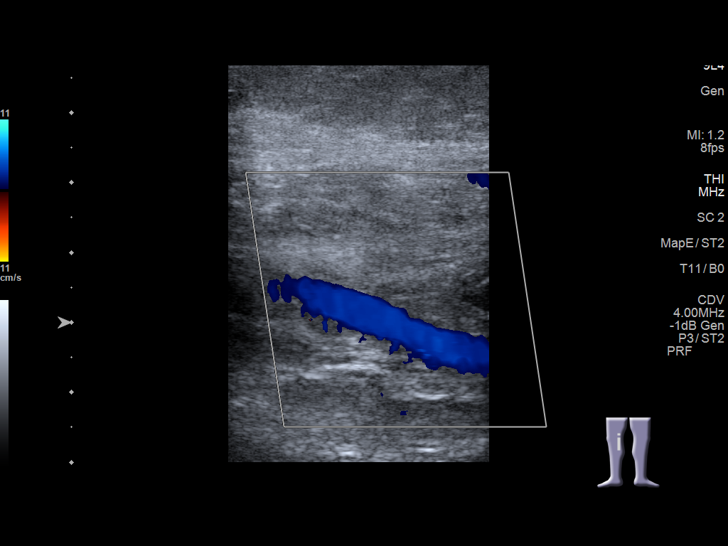
[im 30/33]
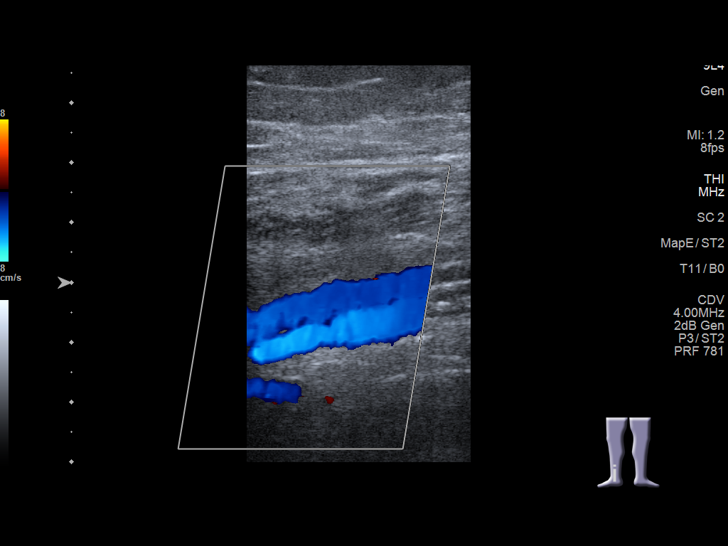
[im 33/33]
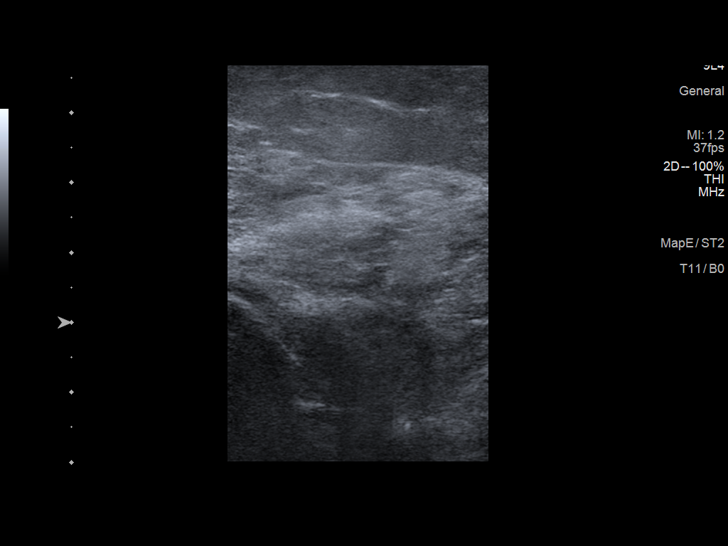

[14 of 24 positions shown; findings below may reference images not displayed]

FINDINGS: VENOUS

Normal compressibility of the common femoral, superficial femoral,
and popliteal veins, as well as the visualized calf veins.
Visualized portions of profunda femoral vein and great saphenous
vein unremarkable. No filling defects to suggest DVT on grayscale or
color Doppler imaging. Doppler waveforms show normal direction of
venous flow, normal respiratory phasicity and response to
augmentation.

Limited views of the contralateral common femoral vein are
unremarkable.

OTHER

None.

Limitations: none
IMPRESSION: No femoropopliteal DVT nor evidence of DVT within the visualized
calf veins.

If clinical symptoms are inconsistent or if there are persistent or
worsening symptoms, further imaging (possibly involving the iliac
veins) may be warranted.

## 2022-01-10 ENCOUNTER — Ambulatory Visit: Payer: HMO | Admitting: Cardiovascular Disease

## 2022-01-23 ENCOUNTER — Ambulatory Visit: Payer: HMO | Admitting: Cardiovascular Disease

## 2022-05-15 ENCOUNTER — Ambulatory Visit: Payer: HMO | Admitting: Cardiovascular Disease

## 2022-11-14 ENCOUNTER — Ambulatory Visit: Payer: PPO | Admitting: Podiatry

## 2023-01-04 DIAGNOSIS — E119 Type 2 diabetes mellitus without complications: Secondary | ICD-10-CM | POA: Diagnosis not present

## 2023-06-24 ENCOUNTER — Ambulatory Visit
Admission: RE | Admit: 2023-06-24 | Discharge: 2023-06-24 | Disposition: A | Discharge status: Home / Self Care | Payer: PPO | Source: Ambulatory Visit | Attending: Physician Assistant | Admitting: Physician Assistant

## 2023-06-24 VITALS — BP 181/85 | HR 92 | Temp 97.9°F | Resp 17

## 2023-06-24 DIAGNOSIS — H6123 Impacted cerumen, bilateral: Secondary | ICD-10-CM | POA: Diagnosis not present

## 2023-06-24 NOTE — Discharge Instructions (Signed)
Recommend over the counter Debrox Follow up with Primary Care Physician if no improvement  Recommend Flonase

## 2023-06-24 NOTE — ED Provider Notes (Signed)
Renaldo Fiddler    CSN: [id number] Arrival date & time: 06/24/23  1112      History   Chief Complaint Chief Complaint  Patient presents with   Ear Fullness   Nasal Congestion   sinus Pressure    HPI Destiny Ramos is a 82 y.o. female.   Patient presents with bilateral ear fullness and hearing loss.  She also complains of mild nasal congestion and sinus pressure.  She reports sensations started on Friday with trouble hearing in the right ear and then progressed to trouble hearing out of the left ear today.  She has tried nothing for the symptoms.  Denies cough, fever, body aches.    Past Medical History:  Diagnosis Date   Chronic venous insufficiency    with history of phlebitis   Diabetes mellitus without complication (HCC)    Fibromyalgia    Hyperlipidemia    Hypertension    Palpitations    Tachycardia     Patient Active Problem List   Diagnosis Date Noted   SVT (supraventricular tachycardia) 06/11/2017   Morbid obesity (HCC) 08/01/2016   Lightheaded 11/19/2012   Mixed hyperlipidemia 05/08/2011   Near syncope 04/06/2011   Tachycardia 04/06/2011   Essential hypertension 04/06/2011    Past Surgical History:  Procedure Laterality Date   ABDOMINAL HYSTERECTOMY     APPENDECTOMY     CATARACT EXTRACTION, BILATERAL     TONSILLECTOMY      OB History   No obstetric history on file.      Home Medications    Prior to Admission medications   Medication Sig Start Date End Date Taking? Authorizing Provider  furosemide (LASIX) 20 MG tablet Take 20 mg by mouth daily as needed. 11/24/19   [provider]  gabapentin (NEURONTIN) 100 MG capsule Take 100 mg by mouth at bedtime. 02/14/21   [provider]  hydrochlorothiazide (HYDRODIURIL) 12.5 MG tablet Take 12.5 mg by mouth daily. 12/09/20   [provider]  lisinopril (ZESTRIL) 5 MG tablet Take 5 mg by mouth daily. 01/19/20   [provider]  metoprolol succinate (TOPROL-XL)  25 MG 24 hr tablet Take 1 tablet (25 mg total) by mouth daily. Please keep your upcoming appointment for refills. 08/23/20   Antonieta Iba, MD  potassium chloride (K-DUR) 10 MEQ tablet Take 1 tablet (10 mEq total) by mouth daily. 06/06/18   Antonieta Iba, MD  pravastatin (PRAVACHOL) 40 MG tablet Take 40 mg by mouth at bedtime. 12/16/20   [provider]  RYBELSUS 14 MG TABS Take 1 tablet by mouth daily. 09/14/20   [provider]    Family History Family History  Problem Relation Age of Onset   Cardiomyopathy Father    Diabetes Maternal Aunt    Diabetes Maternal Uncle    Diabetes Maternal Uncle     Social History Social History   Tobacco Use   Smoking status: Never   Smokeless tobacco: Never  Substance Use Topics   Alcohol use: Yes    Comment: occasionally - not even weekly - wine   Drug use: No     Allergies   Metformin and related and Zithromax [azithromycin]   Review of Systems Review of Systems  Constitutional:  Negative for chills and fever.  HENT:  Positive for hearing loss and sinus pressure. Negative for ear pain and sore throat.   Eyes:  Negative for pain and visual disturbance.  Respiratory:  Negative for cough and shortness of breath.  Cardiovascular:  Negative for chest pain and palpitations.  Gastrointestinal:  Negative for abdominal pain and vomiting.  Genitourinary:  Negative for dysuria and hematuria.  Musculoskeletal:  Negative for arthralgias and back pain.  Skin:  Negative for color change and rash.  Neurological:  Negative for seizures and syncope.  All other systems reviewed and are negative.    Physical Exam Triage Vital Signs ED Triage Vitals  Encounter Vitals Group     BP 06/24/23 1126 (!) 181/85     Systolic BP Percentile --      Diastolic BP Percentile --      Pulse Rate 06/24/23 1126 92     Resp 06/24/23 1126 17     Temp 06/24/23 1126 97.9 F (36.6 C)     Temp Source 06/24/23 1126 Oral     SpO2 06/24/23  1126 95 %     Weight --      Height --      Head Circumference --      Peak Flow --      Pain Score 06/24/23 1127 0     Pain Loc --      Pain Education --      Exclude from Growth Chart --    No data found.  Updated Vital Signs BP (!) 181/85 (BP Location: Left Arm)   Pulse 92   Temp 97.9 F (36.6 C) (Oral)   Resp 17   SpO2 95%   Visual Acuity Right Eye Distance:   Left Eye Distance:   Bilateral Distance:    Right Eye Near:   Left Eye Near:    Bilateral Near:     Physical Exam Vitals and nursing note reviewed.  Constitutional:      General: She is not in acute distress.    Appearance: She is well-developed.  HENT:     Head: Normocephalic and atraumatic.     Right Ear: There is impacted cerumen.     Left Ear: There is impacted cerumen.  Eyes:     Conjunctiva/sclera: Conjunctivae normal.  Cardiovascular:     Rate and Rhythm: Normal rate and regular rhythm.     Heart sounds: No murmur heard. Pulmonary:     Effort: Pulmonary effort is normal. No respiratory distress.     Breath sounds: Normal breath sounds.  Abdominal:     Palpations: Abdomen is soft.     Tenderness: There is no abdominal tenderness.  Musculoskeletal:        General: No swelling.     Cervical back: Neck supple.  Skin:    General: Skin is warm and dry.     Capillary Refill: Capillary refill takes less than 2 seconds.  Neurological:     Mental Status: She is alert.  Psychiatric:        Mood and Affect: Mood normal.      UC Treatments / Results  Labs (all labs ordered are listed, but only abnormal results are displayed) Labs Reviewed - No data to display  EKG   Radiology No results found.  Procedures Procedures (including critical care time)  Medications Ordered in UC Medications - No data to display  Initial Impression / Assessment and Plan / UC Course  I have reviewed the triage vital signs and the nursing notes.  Pertinent labs & imaging results that were available during  my care of the patient were reviewed by me and considered in my medical decision making (see chart for details).     Bilateral cerumen  impaction.  Ear lavage in clinic today with mild cerumen removal.  Patient reports she is able to hear better out of both ears after a lavage.  Recommend Debrox at home and follow-up with PCP if no improvement.  Advised Flonase given mild sinus pressure.  Return precautions discussed. Final Clinical Impressions(s) / UC Diagnoses   Final diagnoses:  Bilateral hearing loss due to cerumen impaction     Discharge Instructions      Recommend over the counter Debrox Follow up with Primary Care Physician if no improvement  Recommend Flonase     ED Prescriptions   None    PDMP not reviewed this encounter.   Ward, Tylene Fantasia, PA-C 06/24/23 1218

## 2023-06-24 NOTE — ED Triage Notes (Signed)
Pt reports bilateral ear fullness, sinus pressure around nose and eyes and nasal congestion. Sx began with ear fullness on Friday,

## 2023-10-25 ENCOUNTER — Encounter: Payer: Self-pay | Admitting: Podiatry

## 2023-10-25 ENCOUNTER — Ambulatory Visit (INDEPENDENT_AMBULATORY_CARE_PROVIDER_SITE_OTHER): Payer: PPO | Admitting: Podiatry

## 2023-10-25 DIAGNOSIS — B351 Tinea unguium: Secondary | ICD-10-CM | POA: Diagnosis not present

## 2023-10-25 DIAGNOSIS — E119 Type 2 diabetes mellitus without complications: Secondary | ICD-10-CM

## 2023-10-25 DIAGNOSIS — M79674 Pain in right toe(s): Secondary | ICD-10-CM

## 2023-10-25 DIAGNOSIS — M79675 Pain in left toe(s): Secondary | ICD-10-CM

## 2023-10-26 NOTE — Progress Notes (Signed)
  Subjective:  Patient ID: Destiny Ramos, female    DOB: 1941-05-26,  MRN: [id number]  Chief Complaint  Patient presents with   Nail Problem    Patient is here for routine nail care, long and thick nails hurting toes    83 y.o. female presents with the above complaint. History confirmed with patient. She is diabetic reports good BS control  Objective:  Physical Exam: warm, good capillary refill, no trophic changes or ulcerative lesions, normal DP and PT pulses, and normal sensory exam. Left Foot: dystrophic yellowed discolored nail plates with subungual debris Right Foot: dystrophic yellowed discolored nail plates with subungual debris   Assessment:   1. Pain due to onychomycosis of toenails of both feet   2. Encounter for diabetic foot exam [facility])      Plan:  Patient was evaluated and treated and all questions answered.  Patient educated on diabetes. Discussed proper diabetic foot care and discussed risks and complications of disease. Educated patient in depth on reasons to return to the office immediately should he/she discover anything concerning or new on the feet. All questions answered. Discussed proper shoes as well.   Discussed the etiology and treatment options for the condition in detail with the patient. Recommended debridement of the nails today. Sharp and mechanical debridement performed of all painful and mycotic nails today. Nails debrided in length and thickness using a nail nipper to level of comfort. Discussed treatment options including appropriate shoe gear. Follow up as needed for painful nails.   Return in about 3 months (around 01/23/2024) for at risk diabetic foot care.

## 2024-01-23 ENCOUNTER — Encounter: Payer: Self-pay | Admitting: Podiatry

## 2024-01-23 ENCOUNTER — Ambulatory Visit: Payer: PPO | Admitting: Podiatry

## 2024-01-23 DIAGNOSIS — I872 Venous insufficiency (chronic) (peripheral): Secondary | ICD-10-CM

## 2024-01-23 DIAGNOSIS — B351 Tinea unguium: Secondary | ICD-10-CM | POA: Diagnosis not present

## 2024-01-23 DIAGNOSIS — M79675 Pain in left toe(s): Secondary | ICD-10-CM

## 2024-01-23 DIAGNOSIS — B353 Tinea pedis: Secondary | ICD-10-CM | POA: Diagnosis not present

## 2024-01-23 DIAGNOSIS — M79674 Pain in right toe(s): Secondary | ICD-10-CM | POA: Diagnosis not present

## 2024-01-23 MED ORDER — CLOTRIMAZOLE-BETAMETHASONE 1-0.05 % EX CREA: 1.0000 | TOPICAL_CREAM | Freq: Every day | CUTANEOUS | 2 refills | Status: AC

## 2024-01-24 NOTE — Progress Notes (Signed)
  Subjective:  Patient ID: CASSONDRA STACHOWSKI, female    DOB: [birth year],  MRN: [id number]  Chief Complaint  Patient presents with   Diabetes    "Trim my toenails and prescribe me something for my dry legs and feet."    83 y.o. female presents with the above complaint. History confirmed with patient.  Blood sugar is doing well.  Her nails are thick and elongated causing discomfort again.  Last debridement was helpful.  She has a new issue of dry scaling skin throughout both lower extremities  Objective:  Physical Exam: warm, good capillary refill, no trophic changes or ulcerative lesions, normal DP and PT pulses, and normal sensory exam.  Bilaterally she has venous stasis hemosiderosis and scaly itching areas consistent with dermatitis Left Foot: dystrophic yellowed discolored nail plates with subungual debris Right Foot: dystrophic yellowed discolored nail plates with subungual debris   Assessment:   1. Venous stasis dermatitis of both lower extremities   2. Tinea pedis of both feet   3. Pain due to onychomycosis of toenails of both feet      Plan:  Patient was evaluated and treated and all questions answered.  Discussed etiology of skin care and itching lesions.  Likely dermatitis could be tinea pedis as well.  Lotrisone prescribed to use this once or twice daily.  Return as needed for this.  Discussed the etiology and treatment options for the condition in detail with the patient. Recommended debridement of the nails today. Sharp and mechanical debridement performed of all painful and mycotic nails today. Nails debrided in length and thickness using a nail nipper to level of comfort. Discussed treatment options including appropriate shoe gear. Follow up as needed for painful nails.   No follow-ups on file.

## 2024-04-23 ENCOUNTER — Ambulatory Visit: Payer: Self-pay | Admitting: Podiatry

## 2024-08-11 ENCOUNTER — Ambulatory Visit (INDEPENDENT_AMBULATORY_CARE_PROVIDER_SITE_OTHER): Payer: Self-pay | Admitting: Podiatry

## 2024-08-11 DIAGNOSIS — Z91198 Patient's noncompliance with other medical treatment and regimen for other reason: Secondary | ICD-10-CM

## 2024-08-11 NOTE — Progress Notes (Signed)
 1. Failure to attend appointment with reason given    Appointment canceled by patient.

## 2024-11-14 ENCOUNTER — Ambulatory Visit: Payer: Self-pay | Admitting: Podiatry

## 2024-11-17 ENCOUNTER — Ambulatory Visit: Admitting: Podiatry

## 2024-12-11 ENCOUNTER — Ambulatory Visit: Admitting: Podiatry
